# Patient Record
Sex: Male | Born: 2000 | Hispanic: No | Marital: Single | Smoking: Current some day smoker
Health system: Southern US, Community
[De-identification: ages and names within clinical notes are randomized; demographics above are authoritative.]

## PROBLEM LIST (undated history)

## (undated) ENCOUNTER — Ambulatory Visit (HOSPITAL_COMMUNITY): Admission: EM | Payer: 59 | Source: Home / Self Care

## (undated) ENCOUNTER — Ambulatory Visit (HOSPITAL_COMMUNITY): Admission: EM | Payer: 59

## (undated) DIAGNOSIS — F913 Oppositional defiant disorder: Principal | ICD-10-CM

## (undated) DIAGNOSIS — F909 Attention-deficit hyperactivity disorder, unspecified type: Secondary | ICD-10-CM

## (undated) DIAGNOSIS — F819 Developmental disorder of scholastic skills, unspecified: Secondary | ICD-10-CM

## (undated) DIAGNOSIS — J069 Acute upper respiratory infection, unspecified: Secondary | ICD-10-CM

## (undated) HISTORY — DX: Attention-deficit hyperactivity disorder, unspecified type: F90.9

## (undated) HISTORY — DX: Oppositional defiant disorder: F91.3

## (undated) HISTORY — DX: Developmental disorder of scholastic skills, unspecified: F81.9

## (undated) HISTORY — DX: Acute upper respiratory infection, unspecified: J06.9

---
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
## 2019-06-04 ENCOUNTER — Encounter (HOSPITAL_COMMUNITY): Payer: Self-pay | Admitting: Psychiatry

## 2019-06-04 ENCOUNTER — Ambulatory Visit (INDEPENDENT_AMBULATORY_CARE_PROVIDER_SITE_OTHER): Payer: 59 | Admitting: Psychiatry

## 2019-06-04 ENCOUNTER — Other Ambulatory Visit: Payer: Self-pay

## 2019-06-04 DIAGNOSIS — F902 Attention-deficit hyperactivity disorder, combined type: Secondary | ICD-10-CM | POA: Diagnosis not present

## 2019-06-04 DIAGNOSIS — F819 Developmental disorder of scholastic skills, unspecified: Secondary | ICD-10-CM

## 2019-06-04 MED ORDER — CLONIDINE HCL 0.2 MG PO TABS: ORAL_TABLET | ORAL | 1 refills | Status: DC

## 2019-06-04 MED ORDER — GUANFACINE HCL ER 4 MG PO TB24: 4.0000 mg | ORAL_TABLET | Freq: Every day | ORAL | 1 refills | Status: DC

## 2019-06-04 NOTE — Progress Notes (Signed)
Virtual Visit via Telephone Note  I connected with Dillon Rodriguez on 06/04/19 at  2:00 PM EDT by telephone and verified that I am speaking with the correct person using two identifiers.   I discussed the limitations, risks, security and privacy concerns of performing an evaluation and management service by telephone and the availability of in person appointments. I also discussed with the patient that there may be a patient responsible charge related to this service. The patient expressed understanding and agreed to proceed.   History of Present Illness: Patient was evaluated by phone session.  I also spoke to his mother who was available.  Patient is doing well on his medication.  He is taking school year off because of the [place] and now actively looking for a job.  He reported his attention and focus is better and is stable.  He is taking the medication and do not report any side effects.  He denies any agitation irritability and his mother endorsed that his behavior is much better.  He is sleeping all night but sometimes he also sleep during the day because he is bored.  He lives with his parents.  Denies drinking or using any illegal substances.  His appetite is okay.  His energy level is good.  He reported his weight is a stable.  He like to continue his current medication.  He has no tremors shakes or any EPS.   Past Psychiatric History: Reviewed. H/O ADHD and seeing outpatient since 2013.  Took Adderall and Vyvanse (dont recall). Took Metadate but discontinued due to poor appetite.  Taking Intuniv and clonidine with good response.  No h/o inpatient, suicidal attempt, mania, psychosis or self abusive behavior.  Psychiatric Specialty Exam: Physical Exam  ROS  There were no vitals taken for this visit.There is no height or weight on file to calculate BMI.  General Appearance: NA  Eye Contact:  NA  Speech:  Clear and Coherent and Slow  Volume:  Normal  Mood:  Euthymic  Affect:  NA  Thought  Process:  Descriptions of Associations: Intact  Orientation:  Full (Time, Place, and Person)  Thought Content:  WDL  Suicidal Thoughts:  No  Homicidal Thoughts:  No  Memory:  Immediate;   Fair Recent;   Good Remote;   Good  Judgement:  Good  Insight:  Good  Psychomotor Activity:  NA  Concentration:  Concentration: Fair and Attention Span: Fair  Recall:  Good  Fund of Knowledge:  Good  Language:  Good  Akathisia:  No  Handed:  Right  AIMS (if indicated):     Assets:  Communication Skills Desire for Improvement Housing Resilience Social Support  ADL's:  Intact  Cognition:  WNL  Sleep:   ok     Assessment and Plan: ADHD, combined type.  Learning disability.  Patient is a stable on his current medication.  I will continue clonidine 0.2 mg at bedtime and Intuniv 4 mg daily.  He is actively looking for a job.  Discussed medication side effects and benefits.  Recommended to call us back if is any question of any concern.  Follow-up in 3 months.  Follow Up Instructions:    I discussed the assessment and treatment plan with the patient. The patient was provided an opportunity to ask questions and all were answered. The patient agreed with the plan and demonstrated an understanding of the instructions.   The patient was advised to call back or seek an in-person evaluation if the symptoms worsen  or if the condition fails to improve as anticipated.  I provided 20 minutes of non-face-to-face time during this encounter.   Kathlee Nations, MD

## 2019-12-02 ENCOUNTER — Ambulatory Visit (INDEPENDENT_AMBULATORY_CARE_PROVIDER_SITE_OTHER): Payer: 59 | Admitting: Psychiatry

## 2019-12-02 ENCOUNTER — Other Ambulatory Visit: Payer: Self-pay

## 2019-12-02 ENCOUNTER — Encounter (HOSPITAL_COMMUNITY): Payer: Self-pay | Admitting: Psychiatry

## 2019-12-02 DIAGNOSIS — F902 Attention-deficit hyperactivity disorder, combined type: Secondary | ICD-10-CM | POA: Diagnosis not present

## 2019-12-02 DIAGNOSIS — F819 Developmental disorder of scholastic skills, unspecified: Secondary | ICD-10-CM | POA: Diagnosis not present

## 2019-12-02 MED ORDER — GUANFACINE HCL ER 4 MG PO TB24: 4.0000 mg | ORAL_TABLET | Freq: Every day | ORAL | 1 refills | Status: DC

## 2019-12-02 MED ORDER — CLONIDINE HCL 0.2 MG PO TABS: ORAL_TABLET | ORAL | 1 refills | Status: DC

## 2019-12-02 NOTE — Progress Notes (Addendum)
Virtual Visit via Telephone Note  I connected with Dillon Rodriguez on 12/02/19 at  2:00 PM EDT by telephone and verified that I am speaking with the correct person using two identifiers.   I discussed the limitations, risks, security and privacy concerns of performing an evaluation and management service by telephone and the availability of in person appointments. I also discussed with the patient that there may be a patient responsible charge related to this service. The patient expressed understanding and agreed to proceed.  Patient Location; Home Provider Location; Office Home  History of Present Illness: Patient was evaluated by phone session.  I also spoke to his mother who was available.  Apparently patient doing very well on medication.  He started part-time working at [employer] and he is happy his job.  He also wants to start school this year.  He denies any irritability, anger or any behavior problem.  His attention concentration is good.  He is sleeping good with the medication.  He lives with his parents.  He denies drinking or using any illegal substances.  He is trying to lose weight and so far he has lost few pounds in past 6 months.  He has no tremors, shakes or any EPS.  He like to continue clonidine and Intuniv.   Past Psychiatric History:Reviewed. H/OADHD LD. Seeing outpatient since 2013. Took Adderall and Vyvanse (dontrecall).Took Metadate but caused poor appetite. Good response Inutuniv and clonidine. No h/oinpatient,suicidal attempt,mania, psychosis or self abusive behavior.  Psychiatric Specialty Exam: Physical Exam  Review of Systems  Weight 132 lb (59.9 kg).There is no height or weight on file to calculate BMI.  General Appearance: NA  Eye Contact:  NA  Speech:  Clear and Coherent and Normal Rate  Volume:  Normal  Mood:  Euthymic  Affect:  NA  Thought Process:  Goal Directed  Orientation:  Full (Time, Place, and Person)  Thought Content:  WDL  Suicidal  Thoughts:  No  Homicidal Thoughts:  No  Memory:  Immediate;   Good Recent;   Fair Remote;   Fair  Judgement:  Intact  Insight:  Present  Psychomotor Activity:  NA  Concentration:  Concentration: Fair and Attention Span: Fair  Recall:  Good  Fund of Knowledge:  Good  Language:  Good  Akathisia:  No  Handed:  Right  AIMS (if indicated):     Assets:  Communication Skills Desire for Improvement Physical Health Resilience Social Support  ADL's:  Intact  Cognition:  WNL  Sleep:   good     Assessment and Plan: ADHD, combined type.  Learning disability.  Patient is a stable on her current medication.  Is able to focus and able to do his work without any issues.  His behavior is under control.  Continue clonidine 0.2 mg at bedtime and Intuniv 4 mg daily.  He started working part-time but also hoping to start school this year.  Discussed medication side effects and benefits.  Recommended to call us back if is any question or any concern.  Follow-up in 6 months.  Follow Up Instructions:    I discussed the assessment and treatment plan with the patient. The patient was provided an opportunity to ask questions and all were answered. The patient agreed with the plan and demonstrated an understanding of the instructions.   The patient was advised to call back or seek an in-person evaluation if the symptoms worsen or if the condition fails to improve as anticipated.  I provided 15 minutes  of non-face-to-face time during this encounter.   Kathlee Nations, MD

## 2020-05-30 ENCOUNTER — Other Ambulatory Visit: Payer: Self-pay

## 2020-05-30 ENCOUNTER — Encounter (HOSPITAL_COMMUNITY): Payer: Self-pay | Admitting: Psychiatry

## 2020-05-30 ENCOUNTER — Telehealth (INDEPENDENT_AMBULATORY_CARE_PROVIDER_SITE_OTHER): Payer: 59 | Admitting: Psychiatry

## 2020-05-30 DIAGNOSIS — F819 Developmental disorder of scholastic skills, unspecified: Secondary | ICD-10-CM

## 2020-05-30 DIAGNOSIS — F902 Attention-deficit hyperactivity disorder, combined type: Secondary | ICD-10-CM | POA: Diagnosis not present

## 2020-05-30 MED ORDER — CLONIDINE HCL 0.2 MG PO TABS: ORAL_TABLET | ORAL | 1 refills | Status: DC

## 2020-05-30 MED ORDER — GUANFACINE HCL ER 4 MG PO TB24: 4.0000 mg | ORAL_TABLET | Freq: Every day | ORAL | 1 refills | Status: DC

## 2020-05-30 NOTE — Progress Notes (Signed)
Virtual Visit via Telephone Note  I connected with Dillon Rodriguez on 05/30/20 at  2:00 PM EDT by telephone and verified that I am speaking with the correct person using two identifiers.  Location: Patient: home Provider: home office   I discussed the limitations, risks, security and privacy concerns of performing an evaluation and management service by telephone and the availability of in person appointments. I also discussed with the patient that there may be a patient responsible charge related to this service. The patient expressed understanding and agreed to proceed.   History of Present Illness: Patient is evaluated by phone session.  I also spoke to his mother who is available.  Patient is doing well on his medication.  He is starting to school at [employer] and so far things are going well.  He stopped working part-time at [employer].  He is sleeping good.  Denies any anger, mood swings, irritability and we reported medicine is helping his attention and concentration.  He enjoys going to school and he has no behavior problems.  His appetite is okay.  He has no tremors, shakes or any EPS.  He has not seen his PCP in a while and he has no blood work.  He denies drinking or using any illegal substances.  He like to keep his current medication.  Past Psychiatric History:Reviewed. H/OADHD LD. Seeing outpatient since 2013. Took Adderall and Vyvanse (dontrecall).Took Metadate but caused poor appetite. Inutuniv and clonidine worked. No h/oinpatient,suicidal attempt,mania, psychosis or self abusive behavior.  Psychiatric Specialty Exam: Physical Exam  Review of Systems  Weight 132 lb (59.9 kg).There is no height or weight on file to calculate BMI.  General Appearance: NA  Eye Contact:  NA  Speech:  Slow  Volume:  Decreased  Mood:  Euthymic  Affect:  NA  Thought Process:  Goal Directed  Orientation:  Full (Time, Place, and Person)  Thought Content:  WDL  Suicidal Thoughts:  No  Homicidal  Thoughts:  No  Memory:  Immediate;   Good Recent;   Fair Remote;   Fair  Judgement:  Intact  Insight:  Present  Psychomotor Activity:  NA  Concentration:  Concentration: Fair and Attention Span: Fair  Recall:  [employer] of Knowledge:  Fair  Language:  Good  Akathisia:  No  Handed:  Right  AIMS (if indicated):     Assets:  Communication Skills Desire for Improvement Housing Social Support  ADL's:  Intact  Cognition:  WNL  Sleep:   good      Assessment and Plan: ADHD, combined type.  Learning disability.  Patient is a stable on his current medication.  His attention, concentration and behavior is under control.  He is sleeping good.  He is started school and he feels good about it.  Continue clonidine 0.2 mg at bedtime and Intuniv 4 mg daily.  He has not done blood work in a while but agreed to have it done now.  We will order CBC, CMP, hemoglobin A1c and TSH.  Recommended to call us back if there is any question or any concern.  Follow-up in 6 months.  Follow Up Instructions:    I discussed the assessment and treatment plan with the patient. The patient was provided an opportunity to ask questions and all were answered. The patient agreed with the plan and demonstrated an understanding of the instructions.   The patient was advised to call back or seek an in-person evaluation if the symptoms worsen or if the condition  fails to improve as anticipated.  I provided 21 minutes of non-face-to-face time during this encounter.   Cleotis Nipper, MD

## 2020-05-31 ENCOUNTER — Other Ambulatory Visit (HOSPITAL_COMMUNITY): Payer: Self-pay | Admitting: *Deleted

## 2020-05-31 ENCOUNTER — Telehealth (HOSPITAL_COMMUNITY): Payer: Self-pay | Admitting: *Deleted

## 2020-05-31 DIAGNOSIS — F902 Attention-deficit hyperactivity disorder, combined type: Secondary | ICD-10-CM

## 2020-05-31 NOTE — Telephone Encounter (Signed)
Writer placed t/c to pt and spoke with mother regarding going to have labs drawn @ [address], [place] as pt lives in [place]. Mother to take pt on 06/03/20.

## 2020-11-28 ENCOUNTER — Telehealth (HOSPITAL_COMMUNITY): Payer: Medicaid Other | Admitting: Psychiatry

## 2020-11-29 ENCOUNTER — Telehealth (INDEPENDENT_AMBULATORY_CARE_PROVIDER_SITE_OTHER): Payer: Commercial Managed Care - HMO | Admitting: Psychiatry

## 2020-11-29 ENCOUNTER — Encounter (HOSPITAL_COMMUNITY): Payer: Self-pay | Admitting: Psychiatry

## 2020-11-29 ENCOUNTER — Other Ambulatory Visit: Payer: Self-pay

## 2020-11-29 DIAGNOSIS — F902 Attention-deficit hyperactivity disorder, combined type: Secondary | ICD-10-CM

## 2020-11-29 DIAGNOSIS — F819 Developmental disorder of scholastic skills, unspecified: Secondary | ICD-10-CM | POA: Diagnosis not present

## 2020-11-29 MED ORDER — GUANFACINE HCL ER 4 MG PO TB24: 4.0000 mg | ORAL_TABLET | Freq: Every day | ORAL | 5 refills | Status: DC

## 2020-11-29 MED ORDER — CLONIDINE HCL 0.2 MG PO TABS: ORAL_TABLET | ORAL | 5 refills | Status: DC

## 2020-11-29 NOTE — Progress Notes (Signed)
Virtual Visit via Telephone Note  I connected with Dillon Rodriguez on 11/29/20 at  1:40 PM EDT by telephone and verified that I am speaking with the correct person using two identifiers.  Location: Patient: Home Provider: Home Office   I discussed the limitations, risks, security and privacy concerns of performing an evaluation and management service by telephone and the availability of in person appointments. I also discussed with the patient that there may be a patient responsible charge related to this service. The patient expressed understanding and agreed to proceed.   History of Present Illness: Patient is evaluated by phone session.  He had stopped school because he felt this is not his future.  He is actively looking for a job.  Even though he was making better grades at [employer] but he withdrew last semester.  So far he has not able to find a job but he is actively looking.  He is sleeping good.  He denies any hyperactivity, irritability, mood swings and he feels the current medicine helping his ADHD symptoms.  His attention and focus is manageable.  He sleeps good.  He admitted weight gain in recent months as he is more active and his appetite is increased.  I also spoke to his mother who reported patient is a stable on the current medication.  Patient and his mother apologized not having blood work but promised to have it done very soon.  So far patient reported no tremors shakes or any EPS.  He denies drinking or using any illegal substances.  Past Psychiatric History:Reviewed. H/OADHDLD. Seeing outpatient since 2013. Took Adderall and Vyvanse (dontrecall).Took Metadate butcausedpoor appetite. Inutuniv and clonidine worked.No h/oinpatient,suicidal attempt,mania, psychosis or self abusive behavior.   Psychiatric Specialty Exam: Physical Exam  Review of Systems  Weight 141 lb (64 kg).There is no height or weight on file to calculate BMI.  General Appearance: NA  Eye Contact:   NA  Speech:  Slow  Volume:  Normal  Mood:  Euthymic  Affect:  NA  Thought Process:  Goal Directed  Orientation:  Full (Time, Place, and Person)  Thought Content:  WDL  Suicidal Thoughts:  No  Homicidal Thoughts:  No  Memory:  Immediate;   Good Recent;   Fair Remote;   Fair  Judgement:  Fair  Insight:  Shallow  Psychomotor Activity:  NA  Concentration:  Concentration: Fair and Attention Span: Fair  Recall:  [employer] of Knowledge:  Fair  Language:  Good  Akathisia:  No  Handed:  Right  AIMS (if indicated):     Assets:  Communication Skills Desire for Improvement Housing Social Support  ADL's:  Intact  Cognition:  WNL  Sleep:   ok      Assessment and Plan: ADHD, combined type.  Learning disability.  Patient like to keep his current medication since it is helping his mood, hyperactivity.  Continue clonidine 0.2 mg at bedtime and Intuniv 4 mg daily.  Reminded have blood work before his next appointment.  He like to keep appointment in 6 months.  Orders were given to the [facility] at [facility].  Discussed medication side effects and benefits.  Patient like to have 30-day supply with 5 more refills as his insurance does not approve a 90-day prescription.  Follow-up in 6 months.  Follow Up Instructions:    I discussed the assessment and treatment plan with the patient. The patient was provided an opportunity to ask questions and all were answered. The patient agreed with  the plan and demonstrated an understanding of the instructions.   The patient was advised to call back or seek an in-person evaluation if the symptoms worsen or if the condition fails to improve as anticipated.  I provided 16 minutes of non-face-to-face time during this encounter.   Kathlee Nations, MD

## 2021-03-30 ENCOUNTER — Other Ambulatory Visit (HOSPITAL_COMMUNITY): Payer: Self-pay | Admitting: Psychiatry

## 2021-03-30 DIAGNOSIS — F902 Attention-deficit hyperactivity disorder, combined type: Secondary | ICD-10-CM

## 2021-03-30 DIAGNOSIS — F819 Developmental disorder of scholastic skills, unspecified: Secondary | ICD-10-CM

## 2021-05-01 ENCOUNTER — Other Ambulatory Visit (HOSPITAL_COMMUNITY): Payer: Self-pay | Admitting: Psychiatry

## 2021-05-01 DIAGNOSIS — F902 Attention-deficit hyperactivity disorder, combined type: Secondary | ICD-10-CM

## 2021-05-01 DIAGNOSIS — F819 Developmental disorder of scholastic skills, unspecified: Secondary | ICD-10-CM

## 2021-05-27 ENCOUNTER — Other Ambulatory Visit (HOSPITAL_COMMUNITY): Payer: Self-pay | Admitting: Psychiatry

## 2021-05-27 DIAGNOSIS — F902 Attention-deficit hyperactivity disorder, combined type: Secondary | ICD-10-CM

## 2021-05-27 DIAGNOSIS — F819 Developmental disorder of scholastic skills, unspecified: Secondary | ICD-10-CM

## 2021-05-31 ENCOUNTER — Other Ambulatory Visit (HOSPITAL_COMMUNITY): Payer: Self-pay | Admitting: Psychiatry

## 2021-05-31 DIAGNOSIS — F902 Attention-deficit hyperactivity disorder, combined type: Secondary | ICD-10-CM

## 2021-05-31 DIAGNOSIS — F819 Developmental disorder of scholastic skills, unspecified: Secondary | ICD-10-CM

## 2021-06-01 ENCOUNTER — Other Ambulatory Visit: Payer: Self-pay

## 2021-06-01 ENCOUNTER — Encounter (HOSPITAL_COMMUNITY): Payer: Self-pay | Admitting: Psychiatry

## 2021-06-01 ENCOUNTER — Telehealth (INDEPENDENT_AMBULATORY_CARE_PROVIDER_SITE_OTHER): Payer: 59 | Admitting: Psychiatry

## 2021-06-01 DIAGNOSIS — F902 Attention-deficit hyperactivity disorder, combined type: Secondary | ICD-10-CM

## 2021-06-01 DIAGNOSIS — F819 Developmental disorder of scholastic skills, unspecified: Secondary | ICD-10-CM

## 2021-06-01 MED ORDER — CLONIDINE HCL 0.2 MG PO TABS: ORAL_TABLET | ORAL | 2 refills | Status: DC

## 2021-06-01 NOTE — Progress Notes (Signed)
Virtual Visit via Telephone Note  I connected with Dillon Rodriguez on 06/01/21 at  2:00 PM EDT by telephone and verified that I am speaking with the correct person using two identifiers.  Location: Patient: Home Provider: Office   I discussed the limitations, risks, security and privacy concerns of performing an evaluation and management service by telephone and the availability of in person appointments. I also discussed with the patient that there may be a patient responsible charge related to this service. The patient expressed understanding and agreed to proceed.   History of Present Illness: Patient is evaluated by phone session.  He is not taking Intuniv for the past few weeks because he does not feel he is hyper anymore.  He is more calm but is still need clonidine for sleep.  He has not able to find a job and he is not in the school.  He lives with his mother.  He apologized not having blood work.  He reported being active at home.  Denies any anger, mood swings, impulsive behavior or agitation.  In the summer he went to [place] to visit his family member and had a birthday there.  He has no tremor or shakes or any EPS.  He denies drinking or using any illegal substances.  He like to keep the clonidine to help his sleep and attention and focus.  Past Psychiatric History: Reviewed. H/O ADHD LD. Seeing outpatient since 2013.  Took Adderall and Vyvanse (dont recall). Took Metadate but caused poor appetite.  Inutuniv and clonidine worked. No h/o inpatient, suicidal attempt, mania, psychosis or self abusive behavior.   Psychiatric Specialty Exam: Physical Exam  Review of Systems  Weight 145 lb (65.8 kg).Body mass index is 24.13 kg/m.  General Appearance: NA  Eye Contact:  NA  Speech:  Slow  Volume:  Decreased  Mood:  Euthymic  Affect:  NA  Thought Process:  Goal Directed  Orientation:  Full (Time, Place, and Person)  Thought Content:  Logical  Suicidal Thoughts:  No   Homicidal Thoughts:  No  Memory:  Immediate;   Good Recent;   Fair Remote;   Fair  Judgement:  Fair  Insight:  Shallow  Psychomotor Activity:  NA  Concentration:  Concentration: Fair and Attention Span: Fair  Recall:  Good  Fund of Knowledge:  Good  Language:  Good  Akathisia:  No  Handed:  Right  AIMS (if indicated):     Assets:  Communication Skills Desire for Improvement Housing Social Support  ADL's:  Intact  Cognition:  WNL  Sleep:   ok     Assessment and Plan: ADHD, combined type.  Learning disability.  Patient stopped taking the Intuniv but like to keep the clonidine 0.2 mg at bedtime to help his insomnia attention and focus.  Reminded that he needed blood work and he promised to have it done soon.  I recommend if he started to have hyperactivity symptoms then call us back so we can resume Intuniv.  Patient agreed with the plan.  Follow up in 3 months  Follow Up Instructions:    I discussed the assessment and treatment plan with the patient. The patient was provided an opportunity to ask questions and all were answered. The patient agreed with the plan and demonstrated an understanding of the instructions.   The patient was advised to call back or seek an in-person evaluation if the symptoms worsen or if the condition fails to improve as anticipated.  I provided 14 minutes  of non-face-to-face time during this encounter.   Dillon Nations, MD

## 2021-06-03 ENCOUNTER — Other Ambulatory Visit (HOSPITAL_COMMUNITY): Payer: Self-pay | Admitting: Psychiatry

## 2021-06-03 DIAGNOSIS — F819 Developmental disorder of scholastic skills, unspecified: Secondary | ICD-10-CM

## 2021-06-03 DIAGNOSIS — F902 Attention-deficit hyperactivity disorder, combined type: Secondary | ICD-10-CM

## 2021-08-08 ENCOUNTER — Other Ambulatory Visit: Payer: Self-pay

## 2021-08-08 ENCOUNTER — Telehealth (HOSPITAL_COMMUNITY): Payer: Medicaid Other | Admitting: Psychiatry

## 2021-08-21 ENCOUNTER — Telehealth (HOSPITAL_COMMUNITY): Payer: Medicaid Other | Admitting: Psychiatry

## 2021-08-30 ENCOUNTER — Other Ambulatory Visit (HOSPITAL_COMMUNITY): Payer: Self-pay | Admitting: Psychiatry

## 2021-08-30 DIAGNOSIS — F819 Developmental disorder of scholastic skills, unspecified: Secondary | ICD-10-CM

## 2021-08-30 DIAGNOSIS — F902 Attention-deficit hyperactivity disorder, combined type: Secondary | ICD-10-CM

## 2021-09-05 ENCOUNTER — Other Ambulatory Visit (HOSPITAL_COMMUNITY): Payer: Self-pay | Admitting: *Deleted

## 2021-09-05 DIAGNOSIS — F819 Developmental disorder of scholastic skills, unspecified: Secondary | ICD-10-CM

## 2021-09-05 DIAGNOSIS — F902 Attention-deficit hyperactivity disorder, combined type: Secondary | ICD-10-CM

## 2021-09-05 MED ORDER — GUANFACINE HCL ER 4 MG PO TB24: 4.0000 mg | ORAL_TABLET | Freq: Every day | ORAL | 0 refills | Status: DC

## 2021-09-05 MED ORDER — CLONIDINE HCL 0.2 MG PO TABS: ORAL_TABLET | ORAL | 0 refills | Status: DC

## 2021-09-06 ENCOUNTER — Other Ambulatory Visit (HOSPITAL_COMMUNITY): Payer: Self-pay | Admitting: Psychiatry

## 2021-09-06 DIAGNOSIS — F819 Developmental disorder of scholastic skills, unspecified: Secondary | ICD-10-CM

## 2021-09-06 DIAGNOSIS — F902 Attention-deficit hyperactivity disorder, combined type: Secondary | ICD-10-CM

## 2021-09-18 ENCOUNTER — Other Ambulatory Visit: Payer: Self-pay

## 2021-09-18 ENCOUNTER — Encounter (HOSPITAL_COMMUNITY): Payer: Self-pay | Admitting: Psychiatry

## 2021-09-18 ENCOUNTER — Telehealth (HOSPITAL_BASED_OUTPATIENT_CLINIC_OR_DEPARTMENT_OTHER): Payer: 59 | Admitting: Psychiatry

## 2021-09-18 DIAGNOSIS — F902 Attention-deficit hyperactivity disorder, combined type: Secondary | ICD-10-CM

## 2021-09-18 DIAGNOSIS — F819 Developmental disorder of scholastic skills, unspecified: Secondary | ICD-10-CM | POA: Diagnosis not present

## 2021-09-18 MED ORDER — CLONIDINE HCL 0.2 MG PO TABS: ORAL_TABLET | ORAL | 2 refills | Status: DC

## 2021-09-18 NOTE — Progress Notes (Signed)
Virtual Visit via Telephone Note  I connected with Dillon Rodriguez on 09/18/21 at 11:20 AM EST by telephone and verified that I am speaking with the correct person using two identifiers.  Location: Patient: Home Provider: Home Office   I discussed the limitations, risks, security and privacy concerns of performing an evaluation and management service by telephone and the availability of in person appointments. I also discussed with the patient that there may be a patient responsible charge related to this service. The patient expressed understanding and agreed to proceed.   History of Present Illness: Patient is evaluated by phone session.  He is taking clonidine every night but is helping his sleep and ADHD symptoms.  He has not taken Intuniv for more than 3 months.  He admitted sometime talkative but not hyper or agitated.  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  He is not sure if he look for a job in the future.  He lives with his mother.  He apologized for not having the blood but now he is not working he is hoping to have it done very soon.  Denies any anxiety, mania, impulsive behavior.  He has no tremor or shakes or any EPS.  He sleeps good.  Like to keep his clonidine which is helping his attention, focus, hyperactivity and sleep.    Past Psychiatric History: Reviewed. H/O ADHD LD. Seeing outpatient since 2013.  Took Adderall and Vyvanse (dont recall). Took Metadate but caused poor appetite.  Inutuniv and clonidine worked. No h/o inpatient, suicidal attempt, mania, psychosis or self abusive behavior.  Psychiatric Specialty Exam: Physical Exam  Review of Systems  Weight 143 lb (64.9 kg).There is no height or weight on file to calculate BMI.  General Appearance: NA  Eye Contact:  NA  Speech:  Normal Rate  Volume:  Normal  Mood:  Euthymic  Affect:  NA  Thought Process:  Goal Directed  Orientation:  Full (Time, Place, and Person)   Thought Content:  Logical  Suicidal Thoughts:  No  Homicidal Thoughts:  No  Memory:  Immediate;   Good Recent;   Fair Remote;   Fair  Judgement:  Fair  Insight:  Present  Psychomotor Activity:  NA  Concentration:  Concentration: Fair and Attention Span: Fair  Recall:  [employer] of Knowledge:  Fair  Language:  Fair  Akathisia:  No  Handed:  Right  AIMS (if indicated):     Assets:  Communication Skills Desire for Improvement Housing Social Support  ADL's:  Intact  Cognition:  WNL  Sleep:   ok      Assessment and Plan: ADHD, combined type.  Learning disability.  Patient is taking clonidine 0.2 mg at bedtime.  He is not taking Intuniv.  Discussed medication side effects and benefits.  Reminded that he needed blood work and he promised to have it done next week.  Recommended to call us back if is any question or any concern.  Follow-up in 3 months.  Follow Up Instructions:    I discussed the assessment and treatment plan with the patient. The patient was provided an opportunity to ask questions and all were answered. The patient agreed with the plan and demonstrated an understanding of the instructions.   The patient was advised to call back or seek an in-person evaluation if the symptoms worsen or if the condition fails to improve as anticipated.  I provided 21  minutes of non-face-to-face time during this encounter.   Kathlee Nations, MD

## 2021-10-06 ENCOUNTER — Other Ambulatory Visit (HOSPITAL_COMMUNITY): Payer: Self-pay

## 2021-10-06 DIAGNOSIS — Z79899 Other long term (current) drug therapy: Secondary | ICD-10-CM

## 2021-10-06 DIAGNOSIS — F819 Developmental disorder of scholastic skills, unspecified: Secondary | ICD-10-CM

## 2021-10-06 DIAGNOSIS — F902 Attention-deficit hyperactivity disorder, combined type: Secondary | ICD-10-CM

## 2021-10-07 LAB — CBC WITH DIFFERENTIAL
Basophils Absolute: 0.1 10*3/uL (ref 0.0–0.2)
Basos: 1 %
EOS (ABSOLUTE): 0 10*3/uL (ref 0.0–0.4)
Eos: 1 %
Hematocrit: 52.3 % — ABNORMAL HIGH (ref 37.5–51.0)
Hemoglobin: 17.2 g/dL (ref 13.0–17.7)
Immature Grans (Abs): 0 10*3/uL (ref 0.0–0.1)
Immature Granulocytes: 0 %
Lymphocytes Absolute: 1.8 10*3/uL (ref 0.7–3.1)
Lymphs: 39 %
MCH: 27.9 pg (ref 26.6–33.0)
MCHC: 32.9 g/dL (ref 31.5–35.7)
MCV: 85 fL (ref 79–97)
Monocytes Absolute: 0.4 10*3/uL (ref 0.1–0.9)
Monocytes: 10 %
Neutrophils Absolute: 2.3 10*3/uL (ref 1.4–7.0)
Neutrophils: 49 %
RBC: 6.17 x10E6/uL — ABNORMAL HIGH (ref 4.14–5.80)
RDW: 11.8 % (ref 11.6–15.4)
WBC: 4.5 10*3/uL (ref 3.4–10.8)

## 2021-10-07 LAB — COMPREHENSIVE METABOLIC PANEL
ALT: 33 IU/L (ref 0–44)
AST: 19 IU/L (ref 0–40)
Albumin/Globulin Ratio: 2.2 (ref 1.2–2.2)
Albumin: 4.9 g/dL (ref 4.1–5.2)
Alkaline Phosphatase: 61 IU/L (ref 44–121)
BUN/Creatinine Ratio: 12 (ref 9–20)
BUN: 13 mg/dL (ref 6–20)
Bilirubin Total: 0.6 mg/dL (ref 0.0–1.2)
CO2: 26 mmol/L (ref 20–29)
Calcium: 10.2 mg/dL (ref 8.7–10.2)
Chloride: 101 mmol/L (ref 96–106)
Creatinine, Ser: 1.08 mg/dL (ref 0.76–1.27)
Globulin, Total: 2.2 g/dL (ref 1.5–4.5)
Glucose: 86 mg/dL (ref 70–99)
Potassium: 4.5 mmol/L (ref 3.5–5.2)
Sodium: 140 mmol/L (ref 134–144)
Total Protein: 7.1 g/dL (ref 6.0–8.5)
eGFR: 100 mL/min/{1.73_m2} (ref >59)

## 2021-10-07 LAB — HEMOGLOBIN A1C
Est. average glucose Bld gHb Est-mCnc: 117 mg/dL
Hgb A1c MFr Bld: 5.7 % — ABNORMAL HIGH (ref 4.8–5.6)

## 2021-10-07 LAB — TSH: TSH: 2.04 u[IU]/mL (ref 0.450–4.500)

## 2021-10-09 ENCOUNTER — Telehealth (HOSPITAL_COMMUNITY): Payer: Self-pay | Admitting: *Deleted

## 2021-10-09 NOTE — Telephone Encounter (Signed)
Lab resulted on 10/07/21 faxed to office form [facility]. HgbA1c 5.7. RBC 6.17. HCT 52.3. all other labs wnl.

## 2021-10-11 DIAGNOSIS — R7303 Prediabetes: Secondary | ICD-10-CM | POA: Insufficient documentation

## 2021-10-17 DIAGNOSIS — R030 Elevated blood-pressure reading, without diagnosis of hypertension: Secondary | ICD-10-CM | POA: Insufficient documentation

## 2021-10-17 DIAGNOSIS — Z0000 Encounter for general adult medical examination without abnormal findings: Secondary | ICD-10-CM | POA: Insufficient documentation

## 2021-11-30 ENCOUNTER — Other Ambulatory Visit: Payer: Self-pay

## 2021-11-30 ENCOUNTER — Emergency Department (HOSPITAL_COMMUNITY)
Admission: EM | Admit: 2021-11-30 | Discharge: 2021-11-30 | Disposition: A | Discharge status: Home / Self Care | Payer: 59 | Attending: Emergency Medicine | Admitting: Emergency Medicine

## 2021-11-30 ENCOUNTER — Encounter (HOSPITAL_COMMUNITY): Payer: Self-pay | Admitting: Oncology

## 2021-11-30 DIAGNOSIS — R0789 Other chest pain: Secondary | ICD-10-CM | POA: Insufficient documentation

## 2021-11-30 DIAGNOSIS — R0602 Shortness of breath: Secondary | ICD-10-CM | POA: Insufficient documentation

## 2021-11-30 DIAGNOSIS — F12920 Cannabis use, unspecified with intoxication, uncomplicated: Secondary | ICD-10-CM | POA: Diagnosis not present

## 2021-11-30 DIAGNOSIS — L509 Urticaria, unspecified: Secondary | ICD-10-CM | POA: Insufficient documentation

## 2021-11-30 LAB — RAPID URINE DRUG SCREEN, HOSP PERFORMED
Amphetamines: NOT DETECTED
Barbiturates: NOT DETECTED
Benzodiazepines: NOT DETECTED
Cocaine: NOT DETECTED
Opiates: NOT DETECTED
Tetrahydrocannabinol: POSITIVE — AB

## 2021-11-30 NOTE — ED Provider Notes (Signed)
?[facility]-EMERGENCY DEPT ?Provider Note ? ? ?CSN: [id number] ?Arrival date & time: 11/30/21  1737 ? ?  ? ?History ? ?Chief Complaint  ?Patient presents with  ? Ingestion  ? ? ?Dillon Rodriguez is a 21 y.o. male. ? ?HPI ?Patient is a 21 year old male who presents to the emergency department via EMS with his parents due to ingestion.  Patient states that he used to smoke marijuana when he was in high school but has not for many years.  States that he purchased delta 8 from a local gas station and smoked it around 3 PM this afternoon.  He had an episode of shortness of breath, hives, and chest tightness after doing so so EMS was contacted.  He was given 500 cc of IV fluids as well as 50 mg of p.o. Benadryl.  He states that he currently feels fatigued but otherwise is asymptomatic.  Denies any headache, chest pain, shortness of breath, abdominal pain, nausea, vomiting.  He is concerned that the drug he took could have been contaminated with another drug and requests a UDS. ?  ? ?Home Medications ?Prior to Admission medications   ?Medication Sig Start Date End Date Taking? Authorizing Provider  ?cloNIDine (CATAPRES) 0.2 MG tablet Take one each evening 09/18/21   Arfeen, Phillips Grout, MD  ?   ? ?Allergies    ?Patient has no known allergies.   ? ?Review of Systems   ?Review of Systems  ?All other systems reviewed and are negative. ?Ten systems reviewed and are negative for acute change, except as noted in the HPI.   ?Physical Exam ?Updated Vital Signs ?BP 132/79 (BP Location: Right Arm)   Pulse 96   Temp 98.2 ?F (36.8 ?C) (Oral)   Resp 13   Ht 5\' 5"  (1.651 m)   Wt 64.4 kg   SpO2 98%   BMI 23.63 kg/m?  ?Physical Exam ?Vitals and nursing note reviewed.  ?Constitutional:   ?   General: He is not in acute distress. ?   Appearance: Normal appearance. He is normal weight. He is not ill-appearing, toxic-appearing or diaphoretic.  ?HENT:  ?   Head: Normocephalic and atraumatic.  ?   Right Ear: External ear  normal.  ?   Left Ear: External ear normal.  ?   Nose: Nose normal.  ?   Mouth/Throat:  ?   Mouth: Mucous membranes are moist.  ?   Pharynx: Oropharynx is clear. No oropharyngeal exudate or posterior oropharyngeal erythema.  ?   Comments: Uvula midline.  No angioedema.  Readily handling secretions.  No stridor. ?Eyes:  ?   General: No scleral icterus.    ?   Right eye: No discharge.     ?   Left eye: No discharge.  ?   Extraocular Movements: Extraocular movements intact.  ?   Conjunctiva/sclera: Conjunctivae normal.  ?Cardiovascular:  ?   Rate and Rhythm: Normal rate and regular rhythm.  ?   Pulses: Normal pulses.  ?   Heart sounds: Normal heart sounds. No murmur heard. ?  No friction rub. No gallop.  ?   Comments: RRR without M/R/G. ?Pulmonary:  ?   Effort: Pulmonary effort is normal. No respiratory distress.  ?   Breath sounds: Normal breath sounds. No stridor. No wheezing, rhonchi or rales.  ?   Comments: Lungs are clear to auscultation bilaterally.  No wheezing, rales, or rhonchi. ?Abdominal:  ?   General: Abdomen is flat.  ?   Palpations: Abdomen is soft.  ?  Tenderness: There is no abdominal tenderness.  ?   Comments: Abdomen is soft and nontender.  ?Musculoskeletal:     ?   General: Normal range of motion.  ?   Cervical back: Normal range of motion and neck supple. No tenderness.  ?Skin: ?   General: Skin is warm and dry.  ?   Comments: No rash.  No hives.  ?Neurological:  ?   General: No focal deficit present.  ?   Mental Status: He is alert and oriented to person, place, and time.  ?Psychiatric:     ?   Mood and Affect: Mood normal.     ?   Behavior: Behavior normal.  ? ?ED Results / Procedures / Treatments   ?Labs ?(all labs ordered are listed, but only abnormal results are displayed) ?Labs Reviewed  ?RAPID URINE DRUG SCREEN, HOSP PERFORMED - Abnormal; Notable for the following components:  ?    Result Value  ? Tetrahydrocannabinol POSITIVE (*)   ? All other components within normal limits   ? ? ?EKG ?None ? ?Radiology ?No results found. ? ?Procedures ?Procedures  ? ? ?Medications Ordered in ED ?Medications - No data to display ? ?ED Course/ Medical Decision Making/ A&P ?  ?                        ?Medical Decision Making ?Amount and/or Complexity of Data Reviewed ?Labs: ordered. ? ?Pt is a 21 y.o. male who presents to the emergency department after ingesting delta 8 marijuana.  He states that he does not regularly use marijuana and has not done so for many years.  This afternoon he smoked delta 8 around 3 PM and began experiencing chest tightness, shortness of breath, as well as hives.  He called EMS who gave him IV fluids as well as p.o. Benadryl.  Upon arrival patient reports fatigue but otherwise denies any complaints.  Denies any chest pain, shortness of breath, rash, difficulty swallowing, nausea, vomiting, abdominal pain. ? ?Labs: ?UDS is positive for tetrahydrocannabinol. ? ?I, Placido Sou, PA-C, personally reviewed and evaluated these images and lab results as part of my medical decision-making. ? ?On my initial exam patient A&O x3.  Speaking clearly and coherently.  Moving all 4 extremities.  No gross deficits.  Heart is regular rate and rhythm without murmurs, rubs, or gallops.  Lungs are clear to auscultation bilaterally.  Abdomen is soft and nontender.  Patient has no physical complaints at this time.  He is concerned that the marijuana that he used could be cross contaminated with another illicit drug so a UDS was obtained which is only positive for tetrahydrocannabinol. ? ?On reassessment patient still asymptomatic.  Appears stable for discharge at this time and he and his parents at bedside are agreeable.  They are going to drive him home.  We discussed return precautions at length.  Their questions were answered and they were amicable at the time of discharge. ? ?Note: Portions of this report may have been transcribed using voice recognition software. Every effort was made to  ensure accuracy; however, inadvertent computerized transcription errors may be present.  ? ?Final Clinical Impression(s) / ED Diagnoses ?Final diagnoses:  ?Cannabis intoxication without complication (HCC)  ? ?Rx / DC Orders ?ED Discharge Orders   ? ? None  ? ?  ? ? ?  ?Placido Sou, PA-C ?11/30/21 2126 ? ?  ?Mancel Bale, MD ?11/30/21 2251 ? ?

## 2021-11-30 NOTE — ED Triage Notes (Signed)
Pt bib [facility] from home d/t ingestion of delta 8 this afternoon.  Pt c/o shob, hives and chest tightness.  Pt tachycardic en route, EMS gave 500 ml bolus of NS.  Fire gave pt 50 mg PO benadryl.   ?

## 2021-11-30 NOTE — Discharge Instructions (Signed)
Please continue to monitor your symptoms closely.  If you develop any new or worsening symptoms please come back to the emergency department immediately. ?

## 2021-12-10 ENCOUNTER — Other Ambulatory Visit (HOSPITAL_COMMUNITY): Payer: Self-pay | Admitting: Psychiatry

## 2021-12-10 DIAGNOSIS — F819 Developmental disorder of scholastic skills, unspecified: Secondary | ICD-10-CM

## 2021-12-10 DIAGNOSIS — F902 Attention-deficit hyperactivity disorder, combined type: Secondary | ICD-10-CM

## 2021-12-15 ENCOUNTER — Telehealth (HOSPITAL_BASED_OUTPATIENT_CLINIC_OR_DEPARTMENT_OTHER): Payer: 59 | Admitting: Psychiatry

## 2021-12-15 ENCOUNTER — Encounter (HOSPITAL_COMMUNITY): Payer: Self-pay | Admitting: Psychiatry

## 2021-12-15 DIAGNOSIS — F902 Attention-deficit hyperactivity disorder, combined type: Secondary | ICD-10-CM | POA: Diagnosis not present

## 2021-12-15 DIAGNOSIS — F819 Developmental disorder of scholastic skills, unspecified: Secondary | ICD-10-CM

## 2021-12-15 MED ORDER — CLONIDINE HCL 0.2 MG PO TABS: ORAL_TABLET | ORAL | 2 refills | Status: DC

## 2021-12-15 NOTE — Progress Notes (Signed)
Virtual Visit via Telephone Note ? ?I connected with Dillon Rodriguez on 12/15/21 at 10:20 AM EDT by telephone and verified that I am speaking with the correct person using two identifiers. ? ?Location: ?Patient: Home ?Provider: Home Office ?  ?I discussed the limitations, risks, security and privacy concerns of performing an evaluation and management service by telephone and the availability of in person appointments. I also discussed with the patient that there may be a patient responsible charge related to this service. The patient expressed understanding and agreed to proceed. ? ? ?History of Present Illness: ?Patient is evaluated by phone session.  Recently he had a visit to the emergency room after he ingested cannabis from a local store.  He experienced chest tightness, shortness of breath and hives and sent to the emergency room.  He was given fluids and UDS positive for cannabis.  Patient told he had smoked weed in the past and thought he can smoke to help his sleep but now he learned that it was mixed with some other agent.  He is taking clonidine which is helping his sleep and hyperactivity.  He does not want to try anything else.  Denies any suicidal thoughts, anger, mania, crying spells or any feeling of hopelessness.  He lives with his mother.  Now he is very interested to start the job at [employer].  He has to go for job training and he is trying to schedule the date.  He does not want to change the medication.  His appetite is okay.  His weight is stable. ? ?Past Psychiatric History: Reviewed. ?H/O ADHD LD. Seeing outpatient since 2013.  Took Adderall and Vyvanse (dont recall). Took Metadate but caused poor appetite.  Inutuniv and clonidine worked. No h/o inpatient, suicidal attempt, mania, psychosis or self abusive behavior. ? ?Recent Results (from the past 2160 hour(s))  ?TSH     Status: None  ? Collection Time: 10/06/21 12:30 PM  ?Result Value Ref Range  ? TSH 2.040 0.450 - 4.500 uIU/mL   ?HgB A1c     Status: Abnormal  ? Collection Time: 10/06/21 12:30 PM  ?Result Value Ref Range  ? Hgb A1c MFr Bld 5.7 (H) 4.8 - 5.6 %  ?  Comment:          Prediabetes: 5.7 - 6.4 ?         Diabetes: >6.4 ?         Glycemic control for adults with diabetes: <7.0 ?  ? Est. average glucose Bld gHb Est-mCnc 117 mg/dL  ?Comprehensive Metabolic Panel (CMET)     Status: None  ? Collection Time: 10/06/21 12:30 PM  ?Result Value Ref Range  ? Glucose 86 70 - 99 mg/dL  ? BUN 13 6 - 20 mg/dL  ? Creatinine, Ser 1.08 0.76 - 1.27 mg/dL  ? eGFR 100 >59 mL/min/1.73  ? BUN/Creatinine Ratio 12 9 - 20  ? Sodium 140 134 - 144 mmol/L  ? Potassium 4.5 3.5 - 5.2 mmol/L  ? Chloride 101 96 - 106 mmol/L  ? CO2 26 20 - 29 mmol/L  ? Calcium 10.2 8.7 - 10.2 mg/dL  ? Total Protein 7.1 6.0 - 8.5 g/dL  ? Albumin 4.9 4.1 - 5.2 g/dL  ? Globulin, Total 2.2 1.5 - 4.5 g/dL  ? Albumin/Globulin Ratio 2.2 1.2 - 2.2  ? Bilirubin Total 0.6 0.0 - 1.2 mg/dL  ? Alkaline Phosphatase 61 44 - 121 IU/L  ? AST 19 0 - 40 IU/L  ? ALT  33 0 - 44 IU/L  ?CBC With Differential     Status: Abnormal  ? Collection Time: 10/06/21 12:30 PM  ?Result Value Ref Range  ? WBC 4.5 3.4 - 10.8 x10E3/uL  ? RBC 6.17 (H) 4.14 - 5.80 x10E6/uL  ? Hemoglobin 17.2 13.0 - 17.7 g/dL  ? Hematocrit 52.3 (H) 37.5 - 51.0 %  ? MCV 85 79 - 97 fL  ? MCH 27.9 26.6 - 33.0 pg  ? MCHC 32.9 31.5 - 35.7 g/dL  ? RDW 11.8 11.6 - 15.4 %  ? Neutrophils 49 Not Estab. %  ? Lymphs 39 Not Estab. %  ? Monocytes 10 Not Estab. %  ? Eos 1 Not Estab. %  ? Basos 1 Not Estab. %  ? Neutrophils Absolute 2.3 1.4 - 7.0 x10E3/uL  ? Lymphocytes Absolute 1.8 0.7 - 3.1 x10E3/uL  ? Monocytes Absolute 0.4 0.1 - 0.9 x10E3/uL  ? EOS (ABSOLUTE) 0.0 0.0 - 0.4 x10E3/uL  ? Basophils Absolute 0.1 0.0 - 0.2 x10E3/uL  ? Immature Granulocytes 0 Not Estab. %  ? Immature Grans (Abs) 0.0 0.0 - 0.1 x10E3/uL  ?Urine rapid drug screen (hosp performed)     Status: Abnormal  ? Collection Time: 11/30/21  7:17 PM  ?Result Value Ref Range  ? Opiates  NONE DETECTED NONE DETECTED  ? Cocaine NONE DETECTED NONE DETECTED  ? Benzodiazepines NONE DETECTED NONE DETECTED  ? Amphetamines NONE DETECTED NONE DETECTED  ? Tetrahydrocannabinol POSITIVE (A) NONE DETECTED  ? Barbiturates NONE DETECTED NONE DETECTED  ?  Comment: (NOTE) ?DRUG SCREEN FOR MEDICAL PURPOSES ?ONLY.  IF CONFIRMATION IS NEEDED ?FOR ANY PURPOSE, NOTIFY LAB ?WITHIN 5 DAYS. ? ?LOWEST DETECTABLE LIMITS ?FOR URINE DRUG SCREEN ?Drug Class                     Cutoff (ng/mL) ?Amphetamine and metabolites    1000 ?Barbiturate and metabolites    200 ?Benzodiazepine                 200 ?Tricyclics and metabolites     300 ?Opiates and metabolites        300 ?Cocaine and metabolites        300 ?THC                            50 ?Performed at [facility], [address] ?[place],  [address] ?  ?  ?  ?Psychiatric Specialty Exam: ?Physical Exam  ?Review of Systems  ?Weight 142 lb (64.4 kg).There is no height or weight on file to calculate BMI.  ?General Appearance: NA  ?Eye Contact:  NA  ?Speech:  Slow  ?Volume:  Normal  ?Mood:  Anxious  ?Affect:  NA  ?Thought Process:  Goal Directed  ?Orientation:  Full (Time, Place, and Person)  ?Thought Content:  WDL  ?Suicidal Thoughts:  No  ?Homicidal Thoughts:  No  ?Memory:  Immediate;   Good ?Recent;   Fair ?Remote;   Fair  ?Judgement:  Fair  ?Insight:  Shallow  ?Psychomotor Activity:  NA  ?Concentration:  Concentration: Fair and Attention Span: Fair  ?Recall:  Fair  ?Fund of Knowledge:  Fair  ?Language:  Fair  ?Akathisia:  No  ?Handed:  Right  ?AIMS (if indicated):     ?Assets:  Communication Skills ?Desire for Improvement ?Housing ?Social Support  ?ADL's:  Intact  ?Cognition:  WNL  ?Sleep:   ok  ? ? ? ? ?  Assessment and Plan: ?ADHD, combined type.  Learning disability. ? ?Review blood work results from the recent emergency room visit.  UDS positive for cannabis.  Patient was provided education not to use illegal substances as it may affect his  medication and cause worsening of symptoms.  He acknowledged and agreed that he will did smoke.  Discussed medication side effects and benefits.  Continue clonidine 0.2 mg at bedtime.  Recommended to call us back if is any question or any concern.  Follow-up in 3 months. ? ?Follow Up Instructions: ? ?  ?I discussed the assessment and treatment plan with the patient. The patient was provided an opportunity to ask questions and all were answered. The patient agreed with the plan and demonstrated an understanding of the instructions. ?  ?The patient was advised to call back or seek an in-person evaluation if the symptoms worsen or if the condition fails to improve as anticipated. ? ?Collaboration of Care: Other provider involved in patient's care AEB notes in epic to review. ? ?Patient/Guardian was advised Release of Information must be obtained prior to any record release in order to collaborate their care with an outside provider. Patient/Guardian was advised if they have not already done so to contact the registration department to sign all necessary forms in order for [place] to release information regarding their care.  ? ?Consent: Patient/Guardian gives verbal consent for treatment and assignment of benefits for services provided during this visit. Patient/Guardian expressed understanding and agreed to proceed.   ? ?I provided 19 minutes of non-face-to-face time during this encounter. ? ? ?Kathlee Nations, MD  ?

## 2022-01-04 ENCOUNTER — Ambulatory Visit (HOSPITAL_COMMUNITY)
Admission: EM | Admit: 2022-01-04 | Discharge: 2022-01-04 | Disposition: A | Discharge status: Home / Self Care | Payer: 59 | Attending: Family Medicine | Admitting: Family Medicine

## 2022-01-04 ENCOUNTER — Encounter (HOSPITAL_COMMUNITY): Payer: Self-pay

## 2022-01-04 DIAGNOSIS — R052 Subacute cough: Secondary | ICD-10-CM

## 2022-01-04 MED ORDER — BENZONATATE 100 MG PO CAPS: 100.0000 mg | ORAL_CAPSULE | Freq: Three times a day (TID) | ORAL | 0 refills | Status: DC

## 2022-01-04 NOTE — ED Provider Notes (Signed)
?[facility] ? ? ? ?CSN: [id number] ?Arrival date & time: 01/04/22  1436 ? ? ?  ? ?History   ?Chief Complaint ?Chief Complaint  ?Patient presents with  ? Cough  ? ? ?HPI ?Dillon Rodriguez is a 21 y.o. male.  ? ?Patient is here for cough, chest congestion for several weeks.  ?No wheezing or sob noted.  ?He is sneezing.  ?No fevers/chills currently, but that was 2 weeks.  ?Minimal runny nose, congestion.  ?Taking otc meds with some help.  ?He does smoke tobacco daily.  Either cigarrettes or cigars.  ? ?Past Medical History:  ?Diagnosis Date  ? ADHD (attention deficit hyperactivity disorder)   ? Learning disability 02/02/2015  ? ODD (oppositional defiant disorder) 02/02/2015  ? Oppositional defiant disorder   ? ? ?Patient Active Problem List  ? Diagnosis Date Noted  ? Migraine without aura and without status migrainosus, not intractable 04/02/2017  ? Tension headache 04/02/2017  ? ADHD (attention deficit hyperactivity disorder), combined type 01/24/2012  ? ? ?History reviewed. No pertinent surgical history. ? ? ? ? ?Home Medications   ? ?Prior to Admission medications   ?Medication Sig Start Date End Date Taking? Authorizing Provider  ?cetirizine (ZYRTEC) 10 MG tablet Take 10 mg by mouth at bedtime. 10/18/21   [provider]  ?cloNIDine (CATAPRES) 0.2 MG tablet Take one each evening 12/15/21   Arfeen, Phillips Grout, MD  ? ? ?Family History ?Family History  ?Problem Relation Age of Onset  ? Migraines Mother   ? Stroke Maternal Grandmother   ? Stroke Maternal Grandfather   ? ? ?Social History ?Social History  ? ?Tobacco Use  ? Smoking status: Never  ? Smokeless tobacco: Never  ?Vaping Use  ? Vaping Use: Never used  ?Substance Use Topics  ? Alcohol use: No  ? Drug use: No  ? ? ? ?Allergies   ?Patient has no known allergies. ? ? ?Review of Systems ?Review of Systems  ?Constitutional: Negative.   ?HENT:  Positive for rhinorrhea. Negative for congestion and sore throat.   ?Respiratory:  Positive for cough.    ?Cardiovascular: Negative.   ?Gastrointestinal: Negative.   ?Genitourinary: Negative.   ? ? ?Physical Exam ?Triage Vital Signs ?ED Triage Vitals [01/04/22 1508]  ?Enc Vitals Group  ?   BP (!) 145/83  ?   Pulse Rate (!) 103  ?   Resp 18  ?   Temp 98.4 ?F (36.9 ?C)  ?   Temp Source Oral  ?   SpO2 97 %  ?   Weight   ?   Height   ?   Head Circumference   ?   Peak Flow   ?   Pain Score 0  ?   Pain Loc   ?   Pain Edu?   ?   Excl. in GC?   ? ?No data found. ? ?Updated Vital Signs ?BP (!) 145/83 (BP Location: Left Arm)   Pulse (!) 103   Temp 98.4 ?F (36.9 ?C) (Oral)   Resp 18   SpO2 97%  ? ?Visual Acuity ?Right Eye Distance:   ?Left Eye Distance:   ?Bilateral Distance:   ? ?Right Eye Near:   ?Left Eye Near:    ?Bilateral Near:    ? ?Physical Exam ?Constitutional:   ?   Appearance: Normal appearance.  ?HENT:  ?   Head: Normocephalic and atraumatic.  ?   Nose: Nose normal.  ?Cardiovascular:  ?   Rate and Rhythm: Normal rate  and regular rhythm.  ?Pulmonary:  ?   Effort: Pulmonary effort is normal.  ?   Breath sounds: Normal breath sounds.  ?Musculoskeletal:  ?   Cervical back: Normal range of motion and neck supple.  ?Skin: ?   General: Skin is warm.  ?Neurological:  ?   General: No focal deficit present.  ?   Mental Status: He is alert.  ?Psychiatric:     ?   Mood and Affect: Mood normal.  ? ? ? ?UC Treatments / Results  ?Labs ?(all labs ordered are listed, but only abnormal results are displayed) ?Labs Reviewed - No data to display ? ?EKG ? ? ?Radiology ?No results found. ? ?Procedures ?Procedures (including critical care time) ? ?Medications Ordered in UC ?Medications - No data to display ? ?Initial Impression / Assessment and Plan / UC Course  ?I have reviewed the triage vital signs and the nursing notes. ? ?Pertinent labs & imaging results that were available during my care of the patient were reviewed by me and considered in my medical decision making (see chart for details). ? ? ?Final Clinical Impressions(s) / UC  Diagnoses  ? ?Final diagnoses:  ?Subacute cough  ? ? ? ?Discharge Instructions   ? ?  ?You were seen today for cough.  ?Your exam is normal today.  ?This could be allergic vs viral.   ?I have sent out a medication to help with the cough.  You may take this up to three times/day.  ?You can take over the counter zyrtec or claritin to see if help with your symptoms.  ?I recommend you stop smoking as well.  ? ? ? ?ED Prescriptions   ? ? Medication Sig Dispense Auth. Provider  ? benzonatate (TESSALON) 100 MG capsule Take 1 capsule (100 mg total) by mouth every 8 (eight) hours. 21 capsule Jannifer Franklin, MD  ? ?  ? ?PDMP not reviewed this encounter. ?  ?Jannifer Franklin, MD ?01/04/22 1520 ? ?

## 2022-01-04 NOTE — ED Triage Notes (Signed)
Greater than one week h/o cough and congestion. Has been benadryl, elderberry and zytrec. ?Denies runny nose, sore throat and sinus pressure. No v/d. ?

## 2022-01-04 NOTE — Discharge Instructions (Signed)
You were seen today for cough.  ?Your exam is normal today.  ?This could be allergic vs viral.   ?I have sent out a medication to help with the cough.  You may take this up to three times/day.  ?You can take over the counter zyrtec or claritin to see if help with your symptoms.  ?I recommend you stop smoking as well.  ?

## 2022-03-14 ENCOUNTER — Telehealth (HOSPITAL_COMMUNITY): Payer: Self-pay | Admitting: *Deleted

## 2022-03-14 ENCOUNTER — Other Ambulatory Visit (HOSPITAL_COMMUNITY): Payer: Self-pay | Admitting: *Deleted

## 2022-03-14 DIAGNOSIS — F819 Developmental disorder of scholastic skills, unspecified: Secondary | ICD-10-CM

## 2022-03-14 DIAGNOSIS — F902 Attention-deficit hyperactivity disorder, combined type: Secondary | ICD-10-CM

## 2022-03-14 MED ORDER — CLONIDINE HCL 0.2 MG PO TABS: ORAL_TABLET | ORAL | 0 refills | Status: DC

## 2022-03-14 NOTE — Telephone Encounter (Signed)
Pt other called very angry stating pt needs a refill of the Clonidine 0.2 mg qhs. Last Rx sent on 12/15/21 #30 with 2 refills. Pt has an appointment scheduled for 03/19/22. Please review and advise.

## 2022-03-15 NOTE — Telephone Encounter (Signed)
Bridge refill done.

## 2022-03-19 ENCOUNTER — Telehealth (HOSPITAL_BASED_OUTPATIENT_CLINIC_OR_DEPARTMENT_OTHER): Payer: 59 | Admitting: Psychiatry

## 2022-03-19 ENCOUNTER — Encounter (HOSPITAL_COMMUNITY): Payer: Self-pay | Admitting: Psychiatry

## 2022-03-19 DIAGNOSIS — F819 Developmental disorder of scholastic skills, unspecified: Secondary | ICD-10-CM

## 2022-03-19 DIAGNOSIS — F902 Attention-deficit hyperactivity disorder, combined type: Secondary | ICD-10-CM | POA: Diagnosis not present

## 2022-03-19 MED ORDER — CLONIDINE HCL 0.2 MG PO TABS: 0.2000 mg | ORAL_TABLET | Freq: Every day | ORAL | 2 refills | Status: DC

## 2022-03-19 NOTE — Progress Notes (Signed)
Virtual Visit via Telephone Note  I connected with Dillon Rodriguez on 03/19/22 at 10:40 AM EDT by telephone and verified that I am speaking with the correct person using two identifiers.  Location: Patient: Home Provider: Home Office   I discussed the limitations, risks, security and privacy concerns of performing an evaluation and management service by telephone and the availability of in person appointments. I also discussed with the patient that there may be a patient responsible charge related to this service. The patient expressed understanding and agreed to proceed.   History of Present Illness: Patient is evaluated by phone session.  She is taking his medication before he go to sleep.  He started working at [employer] and [employer] swing shifts 40 hours a week.  He admitted he has been active and sometime does not take his meal on time.  He lost weight since the last visit.  However he feels fine and denies any anger, irritability, mood swing or suicidal thoughts.  He feels his attention and focus is good with the medication.  His job is going well and is happy.  He does not want to change the medication since it is working his attention focus and mood.  He admitted has not seen PCP in a while.  Past Psychiatric History: Reviewed. H/O ADHD LD. Seeing outpatient since 2013.  Took Adderall and Vyvanse (dont recall). Took Metadate but caused poor appetite.  Inutuniv and clonidine worked. No h/o inpatient, suicidal attempt, mania, psychosis or self abusive behavior.   Psychiatric Specialty Exam: Physical Exam  Review of Systems  Weight 130 lb (59 kg).There is no height or weight on file to calculate BMI.  General Appearance: NA  Eye Contact:  NA  Speech:  Slow  Volume:  Decreased  Mood:  Euthymic  Affect:  NA  Thought Process:  Goal Directed  Orientation:  Full (Time, Place, and Person)  Thought Content:  WDL  Suicidal Thoughts:  No  Homicidal Thoughts:  No  Memory:  Immediate;    Good Recent;   Fair Remote;   Fair  Judgement:  Intact  Insight:  Present  Psychomotor Activity:  NA  Concentration:  Concentration: Fair and Attention Span: Fair  Recall:  [employer] of Knowledge:  Good  Language:  Good  Akathisia:  No  Handed:  Right  AIMS (if indicated):     Assets:  Communication Skills Desire for Improvement Housing Resilience Social Support  ADL's:  Intact  Cognition:  WNL  Sleep:   ok with meds      Assessment and Plan: ADHD, combined type.  Learning disability.  Patient lost weight since the last visit but also feel because of his working 40 hours at [employer] and [employer] and swing shifts.  Sometimes he does not take his meal on time.  He reported his attention focus is good.  He lives with his parents and there has been no issue.  We will continue clonidine 0.2 mg at bedtime.  I encouraged to see his primary care physician if he continues to lose weight without trying.  Patient agreed with the plan.  Follow-up in 3 months.  Recommended to call us back if is any concerns about medication or having any side effects.  Follow Up Instructions:    I discussed the assessment and treatment plan with the patient. The patient was provided an opportunity to ask questions and all were answered. The patient agreed with the plan and demonstrated an understanding of the instructions.  The patient was advised to call back or seek an in-person evaluation if the symptoms worsen or if the condition fails to improve as anticipated.  Collaboration of Care: Primary Care Provider AEB notes are available in epic to review.  Encouraged to make appointment with the PCP.  Patient/Guardian was advised Release of Information must be obtained prior to any record release in order to collaborate their care with an outside provider. Patient/Guardian was advised if they have not already done so to contact the registration department to sign all necessary forms in order for [place] to  release information regarding their care.   Consent: Patient/Guardian gives verbal consent for treatment and assignment of benefits for services provided during this visit. Patient/Guardian expressed understanding and agreed to proceed.    I provided 22 minutes of non-face-to-face time during this encounter.   Cleotis Nipper, MD

## 2022-06-14 ENCOUNTER — Telehealth (HOSPITAL_COMMUNITY): Payer: Self-pay

## 2022-06-14 NOTE — Telephone Encounter (Signed)
He should have until next appointment.

## 2022-06-14 NOTE — Telephone Encounter (Signed)
Called pharmacy spoke to [place] she stated that the patient did not have any refills on file.

## 2022-06-14 NOTE — Telephone Encounter (Signed)
Patients mother called to request a refill  Last appt 03/19/22 Next appt 06/18/22  cloNIDine (CATAPRES) 0.2 MG tablet 30 tablet 2 03/19/2022    Sig - Route: Take 1 tablet (0.2 mg total) by mouth at bedtime. - Oral   Sent to pharmacy as: cloNIDine (CATAPRES) 0.2 MG tablet   E-Prescribing Status: Receipt confirmed by pharmacy (03/19/2022 10:59 AM EDT)

## 2022-06-18 ENCOUNTER — Telehealth (HOSPITAL_BASED_OUTPATIENT_CLINIC_OR_DEPARTMENT_OTHER): Payer: 59 | Admitting: Psychiatry

## 2022-06-18 ENCOUNTER — Encounter (HOSPITAL_COMMUNITY): Payer: Self-pay | Admitting: Psychiatry

## 2022-06-18 DIAGNOSIS — F902 Attention-deficit hyperactivity disorder, combined type: Secondary | ICD-10-CM

## 2022-06-18 DIAGNOSIS — F819 Developmental disorder of scholastic skills, unspecified: Secondary | ICD-10-CM

## 2022-06-18 MED ORDER — CLONIDINE HCL 0.2 MG PO TABS: 0.2000 mg | ORAL_TABLET | Freq: Every day | ORAL | 3 refills | Status: DC

## 2022-06-18 NOTE — Progress Notes (Signed)
Virtual Visit via Telephone Note  I connected with Dillon Rodriguez on 06/18/22 at 10:20 AM EDT by telephone and verified that I am speaking with the correct person using two identifiers.  Location: Patient: Work Provider: Biomedical scientist   I discussed the limitations, risks, security and privacy concerns of performing an evaluation and management service by telephone and the availability of in person appointments. I also discussed with the patient that there may be a patient responsible charge related to this service. The patient expressed understanding and agreed to proceed.   History of Present Illness: Patient is evaluated by phone session.  He is taking clonidine every night because helping his sleep and his hyperactivity.  He is working at [employer] swing shifts.  He lives with his mother and things are going very well.  He has no issues.  He denies any agitation, anger, irritability.  He started working out and he had lost weight since the last visit.  He had blood work earlier this year and he was told he is prediabetic but repeat blood work was normal.  He denies any hallucination, paranoia, suicidal thoughts.  He denies drinking or using any illegal substances.  Like to keep his current medication.  Past Psychiatric History: Reviewed. H/O ADHD LD. Seeing outpatient since 2013.  Took Adderall and Vyvanse (dont recall). Took Metadate but caused poor appetite.  Inutuniv and clonidine worked. No h/o inpatient, suicidal attempt, mania, psychosis or self abusive behavior.   Psychiatric Specialty Exam: Physical Exam  Review of Systems  Weight 121 lb (54.9 kg).There is no height or weight on file to calculate BMI.  General Appearance: NA  Eye Contact:  NA  Speech:  Slow  Volume:  Normal  Mood:  Euthymic  Affect:  NA  Thought Process:  Goal Directed  Orientation:  Full (Time, Place, and Person)  Thought Content:  Logical  Suicidal Thoughts:  No  Homicidal Thoughts:  No  Memory:   Immediate;   Good Recent;   Fair Remote;   Fair  Judgement:  Intact  Insight:  Shallow  Psychomotor Activity:  Normal  Concentration:  Concentration: Fair and Attention Span: Fair  Recall:  [employer] of Knowledge:  Good  Language:  Good  Akathisia:  No  Handed:  Right  AIMS (if indicated):     Assets:  Communication Skills Desire for Improvement Housing Social Support Transportation  ADL's:  Intact  Cognition:  WNL  Sleep:   ok      Assessment and Plan: ADHD, combined type.  Learning disability.  Patient is stable on his current medication.  He had lost another 3 pounds since the last visit.  I encouraged should contact his PCP to have his blood work drawn.  Patient promised to do it.  Continue clonidine 0.2 mg at bedtime.  Recommended to call us back if he has any question or any concern.  Follow-up in 3 months.  Follow Up Instructions:    I discussed the assessment and treatment plan with the patient. The patient was provided an opportunity to ask questions and all were answered. The patient agreed with the plan and demonstrated an understanding of the instructions.   The patient was advised to call back or seek an in-person evaluation if the symptoms worsen or if the condition fails to improve as anticipated.  Collaboration of Care: Primary Care Provider AEB patient was encouraged to make appointment with PCP.  Patient/Guardian was advised Release of Information must be obtained prior  to any record release in order to collaborate their care with an outside provider. Patient/Guardian was advised if they have not already done so to contact the registration department to sign all necessary forms in order for [place] to release information regarding their care.   Consent: Patient/Guardian gives verbal consent for treatment and assignment of benefits for services provided during this visit. Patient/Guardian expressed understanding and agreed to proceed.    I provided 15 minutes  of non-face-to-face time during this encounter.   Kathlee Nations, MD

## 2022-08-21 ENCOUNTER — Ambulatory Visit (HOSPITAL_COMMUNITY)
Admission: RE | Admit: 2022-08-21 | Discharge: 2022-08-21 | Disposition: A | Discharge status: Home / Self Care | Payer: 59 | Source: Ambulatory Visit | Attending: Emergency Medicine | Admitting: Emergency Medicine

## 2022-08-21 ENCOUNTER — Encounter (HOSPITAL_COMMUNITY): Payer: Self-pay

## 2022-08-21 VITALS — BP 130/71 | HR 88 | Temp 99.5°F | Resp 18

## 2022-08-21 DIAGNOSIS — J029 Acute pharyngitis, unspecified: Secondary | ICD-10-CM | POA: Diagnosis not present

## 2022-08-21 DIAGNOSIS — J358 Other chronic diseases of tonsils and adenoids: Secondary | ICD-10-CM | POA: Diagnosis not present

## 2022-08-21 DIAGNOSIS — H9313 Tinnitus, bilateral: Secondary | ICD-10-CM | POA: Diagnosis not present

## 2022-08-21 LAB — POCT RAPID STREP A, ED / UC: Streptococcus, Group A Screen (Direct): NEGATIVE

## 2022-08-21 MED ORDER — LIDOCAINE VISCOUS HCL 2 % MT SOLN
OROMUCOSAL | Status: AC
  Filled 2022-08-21: qty 15

## 2022-08-21 MED ORDER — LIDOCAINE VISCOUS HCL 2 % MT SOLN
15.0000 mL | Freq: Once | OROMUCOSAL | Status: AC
  Administered 2022-08-21: 15 mL via OROMUCOSAL

## 2022-08-21 MED ORDER — LIDOCAINE VISCOUS HCL 2 % MT SOLN: 15.0000 mL | OROMUCOSAL | 0 refills | Status: DC | PRN

## 2022-08-21 MED ORDER — IBUPROFEN 800 MG PO TABS
800.0000 mg | ORAL_TABLET | Freq: Once | ORAL | Status: AC
  Administered 2022-08-21: 800 mg via ORAL

## 2022-08-21 MED ORDER — IBUPROFEN 800 MG PO TABS
ORAL_TABLET | ORAL | Status: AC
  Filled 2022-08-21: qty 1

## 2022-08-21 NOTE — ED Triage Notes (Signed)
Pt states that he is having left ear pain and ringing x 2 weeks, sore throat. He has taken tylenol, vicks vapor meds, honey and tea these have helped but only temp. Pt denies any fever.   Father wants strep test.

## 2022-08-21 NOTE — ED Provider Notes (Signed)
[facility]    CSN: [id number] Arrival date & time: 08/21/22  1019     History   Chief Complaint Chief Complaint  Patient presents with   Ear Fullness    sore throat - Entered by patient   Sore Throat    HPI Dillon Rodriguez is a 21 y.o. male.  Presents with 2-week history of sore throat, worsening over the last several days.  Reports 10/10 pain today Has tried Tylenol No medications given today Dad would like a strep test  Additionally reports 2-week history of ear pain and ringing in the ears Denies any nasal congestion or drainage.  No cough. No fevers.  No known sick contacts, he does not attend school or work   Past Medical History:  Diagnosis Date   ADHD (attention deficit hyperactivity disorder)    Learning disability 02/02/2015   ODD (oppositional defiant disorder) 02/02/2015   Oppositional defiant disorder     Patient Active Problem List   Diagnosis Date Noted   Migraine without aura and without status migrainosus, not intractable 04/02/2017   Tension headache 04/02/2017   ADHD (attention deficit hyperactivity disorder), combined type 01/24/2012    History reviewed. No pertinent surgical history.     Home Medications    Prior to Admission medications   Medication Sig Start Date End Date Taking? Authorizing Provider  cloNIDine (CATAPRES) 0.2 MG tablet Take 1 tablet (0.2 mg total) by mouth at bedtime. 06/18/22  Yes Arfeen, Phillips Grout, MD  lidocaine (XYLOCAINE) 2 % solution Use as directed 15 mLs in the mouth or throat every 3 (three) hours as needed for mouth pain. Gargle and spit 08/21/22  Yes Damara Klunder, Lurena Joiner, PA-C    Family History Family History  Problem Relation Age of Onset   Migraines Mother    Stroke Maternal Grandmother    Stroke Maternal Grandfather     Social History Social History   Tobacco Use   Smoking status: Some Days    Types: Cigars   Smokeless tobacco: Never  Vaping Use   Vaping Use: Never used  Substance Use  Topics   Alcohol use: Yes   Drug use: No     Allergies   Patient has no known allergies.   Review of Systems Review of Systems  As per HPI  Physical Exam Triage Vital Signs ED Triage Vitals  Enc Vitals Group     BP 08/21/22 1045 130/71     Pulse Rate 08/21/22 1045 88     Resp 08/21/22 1045 18     Temp 08/21/22 1045 99.5 F (37.5 C)     Temp Source 08/21/22 1045 Oral     SpO2 08/21/22 1045 97 %     Weight --      Height --      Head Circumference --      Peak Flow --      Pain Score 08/21/22 1042 10     Pain Loc --      Pain Edu? --      Excl. in GC? --    No data found.  Updated Vital Signs BP 130/71 (BP Location: Right Arm)   Pulse 88   Temp 99.5 F (37.5 C) (Oral)   Resp 18   SpO2 97%      Physical Exam Vitals and nursing note reviewed.  Constitutional:      General: He is not in acute distress.    Appearance: He is well-developed. He is not ill-appearing.  Comments: Tolerating own secretions, normal phonation  HENT:     Right Ear: Tympanic membrane and ear canal normal.     Left Ear: Tympanic membrane and ear canal normal.     Ears:     Comments: No sign of infection or irritation. TMs are intact    Mouth/Throat:     Mouth: Mucous membranes are moist.     Pharynx: Oropharynx is clear. Uvula midline. Posterior oropharyngeal erythema present.     Tonsils: No tonsillar exudate or tonsillar abscesses. 1+ on the right. 3+ on the left.  Cardiovascular:     Rate and Rhythm: Normal rate and regular rhythm.     Pulses: Normal pulses.     Heart sounds: Normal heart sounds.  Pulmonary:     Effort: Pulmonary effort is normal.     Breath sounds: Normal breath sounds.  Abdominal:     Tenderness: There is no abdominal tenderness.  Musculoskeletal:     Cervical back: Normal range of motion. No rigidity.  Lymphadenopathy:     Cervical: No cervical adenopathy.  Skin:    General: Skin is warm and dry.     Findings: No rash.  Neurological:     Mental  Status: He is alert and oriented to person, place, and time.     UC Treatments / Results  Labs (all labs ordered are listed, but only abnormal results are displayed) Labs Reviewed  CULTURE, GROUP A STREP [facility])  POCT RAPID STREP A, ED / UC    EKG  Radiology No results found.  Procedures Procedures (including critical care time)  Medications Ordered in UC Medications  lidocaine (XYLOCAINE) 2 % viscous mouth solution 15 mL (15 mLs Mouth/Throat Given 08/21/22 1137)  ibuprofen (ADVIL) tablet 800 mg (800 mg Oral Given 08/21/22 1136)    Initial Impression / Assessment and Plan / UC Course  I have reviewed the triage vital signs and the nursing notes.  Pertinent labs & imaging results that were available during my care of the patient were reviewed by me and considered in my medical decision making (see chart for details).  Strep negative, will culture No signs of PTA on exam, but left tonsil significantly larger than the R For this I recommend follow up with ENT for further evaluation. They can also evaluate his tinnitus. No sign of ear or sinus infection today. Lidocaine gargle given with improvement in throat pain Ibuprofen dose given in clinic. Discussed viral etiology, symptomatic care, continuing ibuprofen and lido gargle as needed Patient agrees to plan  Final Clinical Impressions(s) / UC Diagnoses   Final diagnoses:  Viral pharyngitis  Tinnitus of both ears     Discharge Instructions      As discussed you can use the lidocaine gargle and spit as often as needed to numb the throat.  Additionally ibuprofen up to 800 mg can be used every 6 hours for pain control.  Make sure you are drinking lots of fluids.  Please follow-up with the ear nose and throat specialist regarding your tonsils and ringing in the ears.  You can call them to make an appointment.    ED Prescriptions     Medication Sig Dispense Auth. Provider   lidocaine (XYLOCAINE) 2 % solution Use as  directed 15 mLs in the mouth or throat every 3 (three) hours as needed for mouth pain. Gargle and spit 100 mL Trish Mancinelli, Lurena Joiner, PA-C      PDMP not reviewed this encounter.   Mckynzie Liwanag, Lurena Joiner, PA-C 08/21/22 1213

## 2022-08-21 NOTE — Discharge Instructions (Addendum)
As discussed you can use the lidocaine gargle and spit as often as needed to numb the throat.  Additionally ibuprofen up to 800 mg can be used every 6 hours for pain control.  Make sure you are drinking lots of fluids.  Please follow-up with the ear nose and throat specialist regarding your tonsils and ringing in the ears.  You can call them to make an appointment.

## 2022-08-22 LAB — CULTURE, GROUP A STREP (THRC)

## 2022-08-23 ENCOUNTER — Telehealth (HOSPITAL_COMMUNITY): Payer: Self-pay | Admitting: Emergency Medicine

## 2022-08-23 MED ORDER — AMOXICILLIN 500 MG PO CAPS: 500.0000 mg | ORAL_CAPSULE | Freq: Two times a day (BID) | ORAL | 0 refills | Status: AC

## 2022-09-17 ENCOUNTER — Encounter (HOSPITAL_COMMUNITY): Payer: Self-pay | Admitting: Psychiatry

## 2022-09-17 ENCOUNTER — Telehealth (HOSPITAL_BASED_OUTPATIENT_CLINIC_OR_DEPARTMENT_OTHER): Payer: Medicaid Other | Admitting: Psychiatry

## 2022-09-17 DIAGNOSIS — F902 Attention-deficit hyperactivity disorder, combined type: Secondary | ICD-10-CM | POA: Diagnosis not present

## 2022-09-17 DIAGNOSIS — F819 Developmental disorder of scholastic skills, unspecified: Secondary | ICD-10-CM | POA: Diagnosis not present

## 2022-09-17 MED ORDER — CLONIDINE HCL 0.2 MG PO TABS: 0.2000 mg | ORAL_TABLET | Freq: Every day | ORAL | 3 refills | Status: DC

## 2022-09-17 NOTE — Progress Notes (Signed)
Virtual Visit via Telephone Note  I connected with Dillon Rodriguez on 09/17/22 at 10:40 AM EST by telephone and verified that I am speaking with the correct person using two identifiers.  Location: Patient: Home Provider: Home Office   I discussed the limitations, risks, security and privacy concerns of performing an evaluation and management service by telephone and the availability of in person appointments. I also discussed with the patient that there may be a patient responsible charge related to this service. The patient expressed understanding and agreed to proceed.   History of Present Illness: Patient is evaluated by phone session.  He is compliant with clonidine every pill is helping his hyperactivity, sleep, anxiety.  He continues to work swing shift at [employer].  He had a good Christmas.  He did go to visit family member and had a good time.  He denies any mania, anger, agitation.  His appetite is okay.  His weight is unchanged from the past.  Denies any hallucination or any suicidal thoughts.  Like to continue his current medication.  He denies drinking or using any illegal substances.  He lives with his mother.  Past Psychiatric History: Reviewed. H/O ADHD LD. Seeing outpatient since 2013.  Took Adderall and Vyvanse (dont recall). Took Metadate but caused poor appetite.  Inutuniv and clonidine worked. No h/o inpatient, suicidal attempt, mania, psychosis or self abusive behavior.   Psychiatric Specialty Exam: Physical Exam  Review of Systems  Weight 120 lb (54.4 kg).There is no height or weight on file to calculate BMI.  General Appearance: NA  Eye Contact:  NA  Speech:  Normal Rate  Volume:  Normal  Mood:  Euthymic  Affect:  NA  Thought Process:  Descriptions of Associations: Intact  Orientation:  Full (Time, Place, and Person)  Thought Content:  Logical  Suicidal Thoughts:  No  Homicidal Thoughts:  No  Memory:  Immediate;   Good Recent;   Fair Remote;   Fair   Judgement:  Fair  Insight:  Shallow  Psychomotor Activity:  NA  Concentration:  Concentration: Fair and Attention Span: Fair  Recall:  [employer] of Knowledge:  Good  Language:  Good  Akathisia:  No  Handed:  Right  AIMS (if indicated):     Assets:  Communication Skills Desire for Improvement Housing Social Support Transportation  ADL's:  Intact  Cognition:  WNL  Sleep:   ok      Assessment and Plan: ADHD, combined type.  Learning disability.  Patient is stable on his current medication.  Continue clonidine 0.2 mg at bedtime.  Discussed medication side effects and benefits.  Recommend to call us back if is any question or any concern.  Follow-up in 3 months.  Follow Up Instructions:    I discussed the assessment and treatment plan with the patient. The patient was provided an opportunity to ask questions and all were answered. The patient agreed with the plan and demonstrated an understanding of the instructions.   The patient was advised to call back or seek an in-person evaluation if the symptoms worsen or if the condition fails to improve as anticipated.  Collaboration of Care: Other provider involved in patient's care AEB notes are available in epic to review.  Patient/Guardian was advised Release of Information must be obtained prior to any record release in order to collaborate their care with an outside provider. Patient/Guardian was advised if they have not already done so to contact the registration department to sign all  necessary forms in order for [place] to release information regarding their care.   Consent: Patient/Guardian gives verbal consent for treatment and assignment of benefits for services provided during this visit. Patient/Guardian expressed understanding and agreed to proceed.    I provided 18 minutes of non-face-to-face time during this encounter.   Kathlee Nations, MD

## 2022-12-17 ENCOUNTER — Telehealth (HOSPITAL_COMMUNITY): Payer: 59 | Admitting: Psychiatry

## 2022-12-26 ENCOUNTER — Telehealth (HOSPITAL_BASED_OUTPATIENT_CLINIC_OR_DEPARTMENT_OTHER): Payer: 59 | Admitting: Psychiatry

## 2022-12-26 ENCOUNTER — Encounter (HOSPITAL_COMMUNITY): Payer: Self-pay | Admitting: Psychiatry

## 2022-12-26 DIAGNOSIS — F902 Attention-deficit hyperactivity disorder, combined type: Secondary | ICD-10-CM | POA: Diagnosis not present

## 2022-12-26 DIAGNOSIS — F819 Developmental disorder of scholastic skills, unspecified: Secondary | ICD-10-CM | POA: Diagnosis not present

## 2022-12-26 MED ORDER — CLONIDINE HCL 0.2 MG PO TABS: 0.2000 mg | ORAL_TABLET | Freq: Every day | ORAL | 3 refills | Status: DC

## 2022-12-26 NOTE — Progress Notes (Signed)
[facility] MD Virtual Progress Note   Patient Location: Home Provider Location: Home Office  I connect with patient by telephone and verified that I am speaking with correct person by using two identifiers. I discussed the limitations of evaluation and management by telemedicine and the availability of in person appointments. I also discussed with the patient that there may be a patient responsible charge related to this service. The patient expressed understanding and agreed to proceed.  Dillon Rodriguez [id number] 22 y.o.  12/26/2022 11:04 AM  History of Present Illness:  Patient is evaluated by phone session.  He is taking clonidine at bedtime which helps his sleep.  Patient apologized missing last appointment.  He reported things are going okay but I asked about his job patient told he did not pass his probation after 6 months working at [employer].  Patient did not want to go to details but he feel currently financially stable and not really interested to find another job.  He then asked if he ever got a job at [employer] then can he stop taking the medication.  I explained he is taking the medication for his symptoms and if he kept the job he can still take the medicine as far as it is helping his symptoms.  He denies any crying spells or any feeling of hopelessness or worthlessness.  He denies any suicidal thoughts or homicidal thoughts.  He is trying to gain weight and recently he started taking vitamins to help his energy.  He has no tremor or shakes or any EPS.  He admitted drinking beer every other day but denies any intoxication, blackouts.  He lives with his mother.  He reported getting along with her mother very well and there has been no recent behavior problems.  He does not have a primary care and if he gets sick he is to go to local urgent care.  Past Psychiatric History: H/O ADHD LD. Seeing outpatient since 2013.  Took Adderall and Vyvanse (dont  recall). Took Metadate but caused poor appetite.  Inutuniv and clonidine worked. No h/o inpatient, suicidal attempt, mania, psychosis or self abusive behavior.    Outpatient Encounter Medications as of 12/26/2022  Medication Sig   cloNIDine (CATAPRES) 0.2 MG tablet Take 1 tablet (0.2 mg total) by mouth at bedtime.   lidocaine (XYLOCAINE) 2 % solution Use as directed 15 mLs in the mouth or throat every 3 (three) hours as needed for mouth pain. Gargle and spit   No facility-administered encounter medications on file as of 12/26/2022.    No results found for this or any previous visit (from the past 2160 hour(s)).   Psychiatric Specialty Exam: Physical Exam  Review of Systems  Weight 125 lb (56.7 kg).There is no height or weight on file to calculate BMI.  General Appearance: NA  Eye Contact:  NA  Speech:  Slow  Volume:  Decreased  Mood:  Dysphoric  Affect:  NA  Thought Process:  Descriptions of Associations: Intact  Orientation:  Full (Time, Place, and Person)  Thought Content:  Rumination  Suicidal Thoughts:  No  Homicidal Thoughts:  No  Memory:  Immediate;   Good Recent;   Fair Remote;   Fair  Judgement:  Fair  Insight:  Shallow  Psychomotor Activity:  Decreased  Concentration:  Concentration: Fair and Attention Span: Fair  Recall:  [employer] of Knowledge:  Fair  Language:  Good  Akathisia:  No  Handed:  Right  AIMS (if  indicated):     Assets:  Communication Skills Desire for Improvement Housing Social Support Transportation  ADL's:  Intact  Cognition:  WNL  Sleep:  ok     Assessment/Plan: Learning disability  ADHD (attention deficit hyperactivity disorder), combined type  Patient is not working anymore.  He is not looking for a job because he reported financially he is stable.  He did not go into detail why he could not pass the probation after 6 months.  He asked about future of taking medication and I explained with answers and if he had any concerns.  So far  he is tolerating medication and reported no tremor or shakes or any EPS.  He sleeps good.  Continue clonidine 0.2 mg at bedtime.  He has no primary care doctor and I encourage should consider getting routine blood work and physical health checkup.  Discussed medication side effects and benefits.  He is not interested in therapy.  Follow up in 4 months.   Follow Up Instructions:     I discussed the assessment and treatment plan with the patient. The patient was provided an opportunity to ask questions and all were answered. The patient agreed with the plan and demonstrated an understanding of the instructions.   The patient was advised to call back or seek an in-person evaluation if the symptoms worsen or if the condition fails to improve as anticipated.    Collaboration of Care: Other provider involved in patient's care AEB notes are available in epic to review.  Patient/Guardian was advised Release of Information must be obtained prior to any record release in order to collaborate their care with an outside provider. Patient/Guardian was advised if they have not already done so to contact the registration department to sign all necessary forms in order for [place] to release information regarding their care.   Consent: Patient/Guardian gives verbal consent for treatment and assignment of benefits for services provided during this visit. Patient/Guardian expressed understanding and agreed to proceed.     I provided 19 minutes of non face to face time during this encounter.  Note: This document was prepared by [employer] voice dictation technology and any errors that results from this process are unintentional.    Cleotis Nipper, MD 12/26/2022

## 2023-04-29 ENCOUNTER — Encounter (HOSPITAL_COMMUNITY): Payer: Self-pay | Admitting: Psychiatry

## 2023-04-29 ENCOUNTER — Telehealth (HOSPITAL_BASED_OUTPATIENT_CLINIC_OR_DEPARTMENT_OTHER): Payer: 59 | Admitting: Psychiatry

## 2023-04-29 VITALS — Wt 125.0 lb

## 2023-04-29 DIAGNOSIS — F819 Developmental disorder of scholastic skills, unspecified: Secondary | ICD-10-CM | POA: Diagnosis not present

## 2023-04-29 DIAGNOSIS — F902 Attention-deficit hyperactivity disorder, combined type: Secondary | ICD-10-CM

## 2023-04-29 MED ORDER — CLONIDINE HCL 0.2 MG PO TABS: 0.2000 mg | ORAL_TABLET | Freq: Every day | ORAL | 3 refills | Status: DC

## 2023-04-29 NOTE — Progress Notes (Signed)
[facility] MD Virtual Progress Note   Patient Location: Home Provider Location: Home Office  I connect with patient by telephone and verified that I am speaking with correct person by using two identifiers. I discussed the limitations of evaluation and management by telemedicine and the availability of in person appointments. I also discussed with the patient that there may be a patient responsible charge related to this service. The patient expressed understanding and agreed to proceed.  Dillon Rodriguez [id number] 22 y.o.  04/29/2023 10:10 AM  History of Present Illness:  Patient is evaluated by phone session.  He has my chart but has not activated yet.  He promised to review session on his next appointment.  Overall he has been doing well.  He sleeps good.  He reported his hyperactivity is much better and sometime he thinks to come off from the medication.  However he is not ready her symptoms are under control.  He is looking into job and trying to update his resume so he can apply to the places.  He lives with his mother.  He admitted drinking beer every other day but denies any intoxication, blackouts, tremors or shakes.  He admitted clonidine helps his mood and keep him calm down.  He is getting along with his mother very well.  There has been no recent argument.  His appetite is okay.  His weight is stable.  He has no established primary care and when he need to see the doctor and get sick he usually go to local urgent care.  He has no tremor or shakes or any EPS.  Past Psychiatric History: H/O ADHD LD. Seeing outpatient since 2013.  Took Adderall and Vyvanse (dont recall). Took Metadate but caused poor appetite.  Inutuniv and clonidine worked. No h/o inpatient, suicidal attempt, mania, psychosis or self abusive behavior.    Outpatient Encounter Medications as of 04/29/2023  Medication Sig   cloNIDine (CATAPRES) 0.2 MG tablet Take 1 tablet (0.2 mg total) by mouth at bedtime.    lidocaine (XYLOCAINE) 2 % solution Use as directed 15 mLs in the mouth or throat every 3 (three) hours as needed for mouth pain. Gargle and spit   No facility-administered encounter medications on file as of 04/29/2023.    No results found for this or any previous visit (from the past 2160 hour(s)).   Psychiatric Specialty Exam: Physical Exam  Review of Systems  Weight 125 lb (56.7 kg).There is no height or weight on file to calculate BMI.  General Appearance: NA  Eye Contact:  NA  Speech:  Slow  Volume:  Normal  Mood:  Euthymic  Affect:  NA  Thought Process:  Goal Directed  Orientation:  Full (Time, Place, and Person)  Thought Content:  WDL  Suicidal Thoughts:  No  Homicidal Thoughts:  No  Memory:  Immediate;   Fair Recent;   Fair Remote;   Fair  Judgement:  Fair  Insight:  Shallow  Psychomotor Activity:  NA  Concentration:  Concentration: Fair and Attention Span: Fair  Recall:  Good  Fund of Knowledge:  Good  Language:  Good  Akathisia:  No  Handed:  Right  AIMS (if indicated):     Assets:  Communication Skills Desire for Improvement Social Support Transportation  ADL's:  Intact  Cognition:  WNL  Sleep:  ok     Assessment/Plan: ADHD (attention deficit hyperactivity disorder), combined type - Plan: cloNIDine (CATAPRES) 0.2 MG tablet  Learning disability - Plan: cloNIDine (CATAPRES) 0.2  MG tablet  Patient is stable on low-dose clonidine.  His hyperactivity is under control.  He is taking in the future may want to come off from the medication but he is not ready as of today.  He like to continue current dose of clonidine 0.2 mg at bedtime.  He is also looking into job and hoping to find a job that is suitable for you.  Discussed medication side effects and benefits.  Continue clonidine 0.2 mg at bedtime.  Patient has no PCP and I encouraged him to have a routine blood work and physical checkup.  I also encouraged to have a mild chart video activated so he can do  video session next time.  Patient acknowledged and agreed.  He is not interested in therapy.  Follow-up in 4 months   Follow Up Instructions:     I discussed the assessment and treatment plan with the patient. The patient was provided an opportunity to ask questions and all were answered. The patient agreed with the plan and demonstrated an understanding of the instructions.   The patient was advised to call back or seek an in-person evaluation if the symptoms worsen or if the condition fails to improve as anticipated.    Collaboration of Care: Other provider involved in patient's care AEB notes are available in epic to review.  Patient/Guardian was advised Release of Information must be obtained prior to any record release in order to collaborate their care with an outside provider. Patient/Guardian was advised if they have not already done so to contact the registration department to sign all necessary forms in order for [place] to release information regarding their care.   Consent: Patient/Guardian gives verbal consent for treatment and assignment of benefits for services provided during this visit. Patient/Guardian expressed understanding and agreed to proceed.     I provided 18 minutes of non face to face time during this encounter.  Note: This document was prepared by [employer] voice dictation technology and any errors that results from this process are unintentional.    Cleotis Nipper, MD 04/29/2023

## 2023-06-14 ENCOUNTER — Ambulatory Visit (HOSPITAL_COMMUNITY)
Admission: EM | Admit: 2023-06-14 | Discharge: 2023-06-14 | Disposition: A | Discharge status: Home / Self Care | Payer: 59 | Attending: Internal Medicine | Admitting: Internal Medicine

## 2023-06-14 ENCOUNTER — Encounter (HOSPITAL_COMMUNITY): Payer: Self-pay | Admitting: Emergency Medicine

## 2023-06-14 DIAGNOSIS — J309 Allergic rhinitis, unspecified: Secondary | ICD-10-CM

## 2023-06-14 NOTE — ED Triage Notes (Signed)
Reports that for past couple months had intermittent nasal congestion. Reports that sometimes will cough up phlegm that is clear. Pt reports father concerned if related to him sleeping under the vent.

## 2023-06-14 NOTE — ED Provider Notes (Signed)
[facility]    CSN: [id number] Arrival date & time: 06/14/23  1149      History   Chief Complaint Chief Complaint  Patient presents with   Nasal Congestion    HPI Dillon Rodriguez is a 22 y.o. male.   The history is provided by the patient.   Congestion for a few months, feels postnasal drainage, had not episode of vomiting several days ago where he vomited phlegm.  Past Medical History:  Diagnosis Date   ADHD (attention deficit hyperactivity disorder)    Learning disability 02/02/2015   ODD (oppositional defiant disorder) 02/02/2015   Oppositional defiant disorder     Patient Active Problem List   Diagnosis Date Noted   Migraine without aura and without status migrainosus, not intractable 04/02/2017   Tension headache 04/02/2017   ADHD (attention deficit hyperactivity disorder), combined type 01/24/2012    History reviewed. No pertinent surgical history.     Home Medications    Prior to Admission medications   Medication Sig Start Date End Date Taking? Authorizing Provider  cloNIDine (CATAPRES) 0.2 MG tablet Take 1 tablet (0.2 mg total) by mouth at bedtime. 04/29/23   Arfeen, Phillips Grout, MD  lidocaine (XYLOCAINE) 2 % solution Use as directed 15 mLs in the mouth or throat every 3 (three) hours as needed for mouth pain. Gargle and spit 08/21/22   Rising, Lurena Joiner, PA-C    Family History Family History  Problem Relation Age of Onset   Migraines Mother    Stroke Maternal Grandmother    Stroke Maternal Grandfather     Social History Social History   Tobacco Use   Smoking status: Some Days    Types: Cigars   Smokeless tobacco: Never  Vaping Use   Vaping status: Never Used  Substance Use Topics   Alcohol use: Yes   Drug use: No     Allergies   Patient has no known allergies.   Review of Systems Review of Systems  Constitutional:  Negative for appetite change, chills, fatigue and fever.  HENT:  Positive for congestion, postnasal drip and  rhinorrhea. Negative for ear discharge, sinus pressure, trouble swallowing and voice change.   Respiratory:  Positive for cough (Occasional).   Cardiovascular:  Negative for chest pain.  Gastrointestinal:  Positive for vomiting (1 episode several days ago after drinking 4 beers, states he vomited up "cold"). Negative for diarrhea and nausea.     Physical Exam Triage Vital Signs ED Triage Vitals [06/14/23 1223]  Encounter Vitals Group     BP 104/68     Systolic BP Percentile      Diastolic BP Percentile      Pulse Rate 65     Resp 14     Temp 98.3 F (36.8 C)     Temp Source Oral     SpO2 98 %     Weight      Height      Head Circumference      Peak Flow      Pain Score 0     Pain Loc      Pain Education      Exclude from Growth Chart    No data found.  Updated Vital Signs BP 104/68 (BP Location: Left Arm)   Pulse 65   Temp 98.3 F (36.8 C) (Oral)   Resp 14   SpO2 98%   Visual Acuity Right Eye Distance:   Left Eye Distance:   Bilateral Distance:    Right  Eye Near:   Left Eye Near:    Bilateral Near:     Physical Exam Vitals and nursing note reviewed.  Constitutional:      Appearance: He is not ill-appearing.  HENT:     Head: Normocephalic and atraumatic.     Right Ear: Tympanic membrane and ear canal normal.     Left Ear: Tympanic membrane and ear canal normal.     Nose: Congestion present.     Comments: Boggy nasal mucosa with clear rhinorrhea Eyes:     Conjunctiva/sclera: Conjunctivae normal.  Cardiovascular:     Rate and Rhythm: Normal rate and regular rhythm.  Pulmonary:     Effort: Pulmonary effort is normal. No respiratory distress.     Breath sounds: Normal breath sounds. No wheezing, rhonchi or rales.  Musculoskeletal:     Cervical back: Neck supple.  Lymphadenopathy:     Cervical: No cervical adenopathy.  Skin:    General: Skin is warm and dry.  Neurological:     Mental Status: He is alert and oriented to person, place, and time.       UC Treatments / Results  Labs (all labs ordered are listed, but only abnormal results are displayed) Labs Reviewed - No data to display  EKG   Radiology No results found.  Procedures Procedures (including critical care time)  Medications Ordered in UC Medications - No data to display  Initial Impression / Assessment and Plan / UC Course  I have reviewed the triage vital signs and the nursing notes.  Pertinent labs & imaging results that were available during my care of the patient were reviewed by me and considered in my medical decision making (see chart for details).     Chronic nasal congestion associated with postnasal drip worse at night, episode of vomiting several days ago.  Over-the-counter remedies tried.  Exam consistent with allergic rhinitis recommend OTC antihistamine and Flonase, follow-up as needed   Final Clinical Impressions(s) / UC Diagnoses   Final diagnoses:  Allergic rhinitis, unspecified seasonality, unspecified trigger     Discharge Instructions      Over the counter antihistamine such as Claritin, Allegra or Zyrtec as directed on the package daily.  Over-the-counter Flonase 2 sprays each nostril once daily. Follow-up with your doctor if your symptoms fail to improve or if you develop new symptoms such as fever, sore throat, chest pain, shortness of breath     ED Prescriptions   None    PDMP not reviewed this encounter.   Meliton Rattan, [place] 06/14/23 1252

## 2023-06-14 NOTE — Discharge Instructions (Signed)
Over the counter antihistamine such as Claritin, Allegra or Zyrtec as directed on the package daily.  Over-the-counter Flonase 2 sprays each nostril once daily. Follow-up with your doctor if your symptoms fail to improve or if you develop new symptoms such as fever, sore throat, chest pain, shortness of breath

## 2023-08-29 ENCOUNTER — Encounter (HOSPITAL_COMMUNITY): Payer: Self-pay | Admitting: Psychiatry

## 2023-08-29 ENCOUNTER — Telehealth (HOSPITAL_COMMUNITY): Payer: 59 | Admitting: Psychiatry

## 2023-08-29 DIAGNOSIS — F902 Attention-deficit hyperactivity disorder, combined type: Secondary | ICD-10-CM

## 2023-08-29 DIAGNOSIS — F819 Developmental disorder of scholastic skills, unspecified: Secondary | ICD-10-CM | POA: Diagnosis not present

## 2023-08-29 MED ORDER — CLONIDINE HCL 0.2 MG PO TABS: 0.2000 mg | ORAL_TABLET | Freq: Every day | ORAL | 3 refills | Status: DC

## 2023-08-29 NOTE — Progress Notes (Signed)
[facility] MD Virtual Progress Note   Patient Location: Home Provider Location: Home   I connect with patient by Video and verified that I am speaking with correct person by using two identifiers. I discussed the limitations of evaluation and management by telemedicine and the availability of in person appointments. I also discussed with the patient that there may be a patient responsible charge related to this service. The patient expressed understanding and agreed to proceed.  Dillon Rodriguez [id number] 22 y.o.  08/29/2023 10:31 AM  History of Present Illness:  Patient is evaluated by video session.  He is doing well on clonidine.  He admitted that when he is not doing anything then he thinks a lot and feels restless but overall medicine is working for his ADHD symptoms.  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  He reported job is going so far okay and there is no stress.  It is a easy job.  He has no tremor or shakes or any EPS.  He lives with his mother.  He denies any anger, mood swing, irritability or any hallucination.  When he is working his focus but when he is not working he sometimes feel his mind is wandering.  He wants to keep the current medication.  He admitted not able to have seen his primary care in a while and no physical done.  He was seen in the emergency room in October for nasal congestion.  No labs were drawn.  Past Psychiatric History: H/O ADHD LD. Seeing outpatient since 2013.  Took Adderall and Vyvanse (dont recall). Took Metadate but caused poor appetite.  Inutuniv and clonidine worked. No h/o inpatient, suicidal attempt, mania, psychosis or self abusive behavior.    Outpatient Encounter Medications as of 08/29/2023  Medication Sig   cloNIDine (CATAPRES) 0.2 MG tablet Take 1 tablet (0.2 mg total) by mouth at bedtime.   lidocaine (XYLOCAINE) 2 % solution Use as directed 15 mLs in the mouth or throat every 3 (three) hours  as needed for mouth pain. Gargle and spit   No facility-administered encounter medications on file as of 08/29/2023.    No results found for this or any previous visit (from the past 2160 hours).   Psychiatric Specialty Exam: Physical Exam  Review of Systems  Weight 126 lb (57.2 kg).There is no height or weight on file to calculate BMI.  General Appearance: Fairly Groomed  Eye Contact:  Fair  Speech:  Normal Rate  Volume:  Normal  Mood:  Euthymic  Affect:  Congruent  Thought Process:  Descriptions of Associations: Intact  Orientation:  Full (Time, Place, and Person)  Thought Content:  WDL  Suicidal Thoughts:  No  Homicidal Thoughts:  No  Memory:  Immediate;   Fair Recent;   Fair Remote;   Fair  Judgement:  Intact  Insight:  Fair  Psychomotor Activity:  Normal  Concentration:  Concentration: Fair and Attention Span: Fair  Recall:  [employer] of Knowledge:  Fair  Language:  Good  Akathisia:  No  Handed:  Right  AIMS (if indicated):     Assets:  Communication Skills Desire for Improvement Housing Social Support Transportation  ADL's:  Intact  Cognition:  WNL  Sleep:  ok     Assessment/Plan: Learning disability - Plan: cloNIDine (CATAPRES) 0.2 MG tablet  ADHD (attention deficit hyperactivity disorder), combined type - Plan: cloNIDine (CATAPRES) 0.2 MG tablet  Patient is stable on low-dose clonidine.  So far no major concern.  His attention, concentration and hyperactivity is stable on the medication.  I emphasized the need to see primary care for physical and blood work.  Continue clonidine 0.2 mg at bedtime.  He is happy as working as a temporary job up to 30 hours a week.  Recommended to call us back if is any question or any concern.  Follow-up in 3 months   Follow Up Instructions:     I discussed the assessment and treatment plan with the patient. The patient was provided an opportunity to ask questions and all were answered. The patient agreed with the plan  and demonstrated an understanding of the instructions.   The patient was advised to call back or seek an in-person evaluation if the symptoms worsen or if the condition fails to improve as anticipated.    Collaboration of Care: Other provider involved in patient's care AEB notes are available in epic to review  Patient/Guardian was advised Release of Information must be obtained prior to any record release in order to collaborate their care with an outside provider. Patient/Guardian was advised if they have not already done so to contact the registration department to sign all necessary forms in order for [place] to release information regarding their care.   Consent: Patient/Guardian gives verbal consent for treatment and assignment of benefits for services provided during this visit. Patient/Guardian expressed understanding and agreed to proceed.     I provided 16 minutes of non face to face time during this encounter.  Note: This document was prepared by [employer] voice dictation technology and any errors that results from this process are unintentional.    Cleotis Nipper, MD 08/29/2023

## 2023-12-23 ENCOUNTER — Telehealth (HOSPITAL_BASED_OUTPATIENT_CLINIC_OR_DEPARTMENT_OTHER): Payer: 59 | Admitting: Psychiatry

## 2023-12-23 ENCOUNTER — Encounter (HOSPITAL_COMMUNITY): Payer: Self-pay | Admitting: Psychiatry

## 2023-12-23 DIAGNOSIS — F819 Developmental disorder of scholastic skills, unspecified: Secondary | ICD-10-CM

## 2023-12-23 DIAGNOSIS — F902 Attention-deficit hyperactivity disorder, combined type: Secondary | ICD-10-CM | POA: Diagnosis not present

## 2023-12-23 MED ORDER — CLONIDINE HCL 0.2 MG PO TABS: 0.2000 mg | ORAL_TABLET | Freq: Every day | ORAL | 3 refills | Status: DC

## 2023-12-23 NOTE — Progress Notes (Signed)
 [facility] MD Virtual Progress Note   Patient Location: In Car Provider Location: Home Office  I connect with patient by video and verified that I am speaking with correct person by using two identifiers. I discussed the limitations of evaluation and management by telemedicine and the availability of in person appointments. I also discussed with the patient that there may be a patient responsible charge related to this service. The patient expressed understanding and agreed to proceed.  Dillon Rodriguez [id number] 23 y.o.  12/23/2023 1:58 PM  History of Present Illness:  Patient is evaluated by video session.  He is taking clonidine 0.2 mg every night which is helping his sleep, hyperactivity, and ADHD symptoms.  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  He works Friday Saturday and Sunday.  He lives with his parents.  Denies any irritability, anger, agitation, mood swings or any suicidal thoughts.  He admitted continued to drink beer when he is not working and usually 1-2 beer maximum a day.  Denies any intoxication, blackouts.  He smokes cigarette but has not smoked marijuana in a while.  Recently he see his primary care Dr. Jaynee Meyer for throat infection and given antibiotic.  He reported his appetite is okay and he lost few pounds because very active.  He do not notice any stress or issue with the work.  Reported is a easy job and he like to continue to do it since it is working very well for him.  His focus attention concentration is good.  He has no tremors or shakes.  He has transportation.  Past Psychiatric History: H/O ADHD and LD. Seeing outpatient since 2013.  Took Adderall and Vyvanse (dont recall). Metadate caused poor appetite.  Inutuniv and clonidine worked. No h/o inpatient, suicidal attempt, mania, psychosis or self abusive behavior.    Outpatient Encounter Medications as of 12/23/2023  Medication Sig   cloNIDine  (CATAPRES) 0.2 MG tablet Take 1 tablet (0.2 mg total) by mouth at bedtime.   lidocaine (XYLOCAINE) 2 % solution Use as directed 15 mLs in the mouth or throat every 3 (three) hours as needed for mouth pain. Gargle and spit   No facility-administered encounter medications on file as of 12/23/2023.    No results found for this or any previous visit (from the past 2160 hours).   Psychiatric Specialty Exam: Physical Exam  Review of Systems  Weight 119 lb (54 kg).There is no height or weight on file to calculate BMI.  General Appearance: Casual  Eye Contact:  Fair  Speech:  Normal Rate  Volume:  Normal  Mood:  Euthymic  Affect:  Congruent  Thought Process:  Goal Directed  Orientation:  Full (Time, Place, and Person)  Thought Content:  Logical  Suicidal Thoughts:  No  Homicidal Thoughts:  No  Memory:  Immediate;   Fair Recent;   Fair Remote;   Fair  Judgement:  Intact  Insight:  Present  Psychomotor Activity:  Normal  Concentration:  Concentration: Fair and Attention Span: Fair  Recall:  Fair  Fund of Knowledge:  Good  Language:  Good  Akathisia:  No  Handed:  Right  AIMS (if indicated):     Assets:  Communication Skills Desire for Improvement Housing Social Support Transportation  ADL's:  Intact  Cognition:  WNL  Sleep:  ok       03/12/2016    2:18 PM  Depression screen PHQ 2/9  Decreased  Interest 0  Down, Depressed, Hopeless 0  PHQ - 2 Score 0    Assessment/Plan: Learning disability - Plan: cloNIDine (CATAPRES) 0.2 MG tablet  ADHD (attention deficit hyperactivity disorder), combined type - Plan: cloNIDine (CATAPRES) 0.2 MG tablet  Patient is stable on low-dose clonidine.  He is pleased that his job is going well and is able to manage without any stress.  Encouraged to see primary care for blood work since has not seen for physical and routine labs in a while.  Patient promised to do it soon.  He does not want to change the medication because it is helping him  keeping him stable.  Continue clonidine 0.2 mg at bedtime.  Recommend to call us  back if is any question or any concern.  Follow-up in 4 months   Follow Up Instructions:     I discussed the assessment and treatment plan with the patient. The patient was provided an opportunity to ask questions and all were answered. The patient agreed with the plan and demonstrated an understanding of the instructions.   The patient was advised to call back or seek an in-person evaluation if the symptoms worsen or if the condition fails to improve as anticipated.    Collaboration of Care: Other provider involved in patient's care AEB notes are available in epic to review  Patient/Guardian was advised Release of Information must be obtained prior to any record release in order to collaborate their care with an outside provider. Patient/Guardian was advised if they have not already done so to contact the registration department to sign all necessary forms in order for us  to release information regarding their care.   Consent: Patient/Guardian gives verbal consent for treatment and assignment of benefits for services provided during this visit. Patient/Guardian expressed understanding and agreed to proceed.     Total encounter time 18 minutes which includes face-to-face time, chart reviewed, care coordination, order entry and documentation during this encounter.   Note: This document was prepared by [employer] voice dictation technology and any errors that results from this process are unintentional.    Arturo Late, MD 12/23/2023

## 2024-01-06 ENCOUNTER — Encounter: Payer: Self-pay | Admitting: Allergy & Immunology

## 2024-01-06 ENCOUNTER — Ambulatory Visit (INDEPENDENT_AMBULATORY_CARE_PROVIDER_SITE_OTHER): Payer: Self-pay | Admitting: Allergy & Immunology

## 2024-01-06 ENCOUNTER — Other Ambulatory Visit: Payer: Self-pay

## 2024-01-06 VITALS — BP 118/70 | HR 71 | Temp 98.2°F | Resp 18 | Ht 65.35 in | Wt 121.4 lb

## 2024-01-06 DIAGNOSIS — J310 Chronic rhinitis: Secondary | ICD-10-CM

## 2024-01-06 DIAGNOSIS — K219 Gastro-esophageal reflux disease without esophagitis: Secondary | ICD-10-CM

## 2024-01-06 MED ORDER — FAMOTIDINE 40 MG PO TABS: 40.0000 mg | ORAL_TABLET | Freq: Every day | ORAL | 3 refills | Status: AC

## 2024-01-06 NOTE — Progress Notes (Signed)
 NEW PATIENT  Date of Service/Encounter:  01/06/24  Consult requested by: Pcp, No   Assessment:   Chronic rhinitis - minimally responsive to antihistamines, therefore I would not be surprised if there is nothing that is positive on testing  Gastroesophageal reflux disease - adding famotidine 40mg  daily EVERY DAY to see if this helps  Current smoker - did not smoke in 2024 without resolution of symptoms, but now smoking again without any worsening of symptoms  Plan/Recommendations:   1. Chronic rhinitis - Because of insurance stipulations, we cannot do skin testing on the same day as your first visit. - We are all working to fight this, but for now we need to do two separate visits.  - We will know more after we do testing at the next visit.  - The skin testing visit can be squeezed in at your convenience.  - Then we can make a more full plan to address all of your symptoms. - Be sure to stop your antihistamines for 3 days before this appointment.  - This will help us  to figure which might be causing symptoms.    2. Gastroesophageal reflux disease - Start Pepcid 40mg  daily to see if that helps. - Take this every day to see if this helps.  - Spicy foods can make GERD worse.  3. Return in about 1 week (around 01/13/2024) for SKIN TESTING (1-55). You can have the follow up appointment with Dr. Idolina Maker or a Nurse Practicioner (our Nurse Practitioners are excellent and always have Physician oversight!).   This note in its entirety was forwarded to the Provider who requested this consultation.  Subjective:   Dillon Rodriguez is a 24 y.o. male presenting today for evaluation of  Chief Complaint  Patient presents with   Allergic Rhinitis     Post nasal  drip, sneezing, mucus clear - tried an otc zyrtec  allergy med and flonase. Finishe 4 day antibiotic las week      Dillon Rodriguez has a history of the following: Patient Active Problem List   Diagnosis Date Noted   Elevated  blood pressure reading 10/17/2021   Wellness examination 10/17/2021   Prediabetes 10/11/2021   Migraine without aura and without status migrainosus, not intractable 04/02/2017   Tension headache 04/02/2017   ADHD (attention deficit hyperactivity disorder), combined type 01/24/2012    History obtained from: chart review and patient.  Discussed the use of AI scribe software for clinical note transcription with the patient and/or guardian, who gave verbal consent to proceed.  Dillon Rodriguez was referred by Pcp, No.     Dillon Rodriguez is a 23 y.o. male presenting for an evaluation of environmental allergies .  Allergic Rhinitis Symptom History: He has experienced environmental allergies for the past two to three years, which began after moving to a new area in 2019. Symptoms include excessive mucus production, particularly in the mornings, and sneezing. He notes improvement in symptoms when staying at friends' houses, suggesting a possible environmental trigger at home. He lives with his mother, who smokes and uses candles and incense, and suspects that environmental factors in his home, such as smoke and potential mold, may contribute to his symptoms. He attempted smoking cessation in 2024, which did not result in symptom improvement. He also tried antihistamines last year without relief.  GERD Symptom History: He has been informed by his dentist about signs of reflux, although he denies heartburn. He has tried Pepcid in the past but does not take it regularly. His dentist has  noted potential reflux-related dental erosion over the past year. He consumes spicy foods regularly, which he acknowledges could exacerbate reflux symptoms.  He has a history of ADHD and currently takes clonidine  for sleep. He has previously tried multiple ADHD medications, including Adderall and Ritalin , but discontinued them due to side effects. No skin problems like eczema and no family history of autoimmune diseases. A cousin has  allergies and nasal polyps.   He was working on a degree in Chief of Staff.  Otherwise, there is no history of other atopic diseases, including drug allergies, stinging insect allergies, or contact dermatitis. There is no significant infectious history. Vaccinations are up to date.    Past Medical History: Patient Active Problem List   Diagnosis Date Noted   Elevated blood pressure reading 10/17/2021   Wellness examination 10/17/2021   Prediabetes 10/11/2021   Migraine without aura and without status migrainosus, not intractable 04/02/2017   Tension headache 04/02/2017   ADHD (attention deficit hyperactivity disorder), combined type 01/24/2012    Medication List:  Allergies as of 01/06/2024   No Known Allergies      Medication List        Accurate as of January 06, 2024 12:28 PM. If you have any questions, ask your nurse or doctor.          azithromycin 250 MG tablet Commonly known as: ZITHROMAX Take 250 mg by mouth as directed.   cloNIDine  0.2 MG tablet Commonly known as: CATAPRES  Take 1 tablet (0.2 mg total) by mouth at bedtime.   famotidine 40 MG tablet Commonly known as: PEPCID Take 1 tablet (40 mg total) by mouth daily. Started by: Rochester Chuck   lidocaine  2 % solution Commonly known as: XYLOCAINE  Use as directed 15 mLs in the mouth or throat every 3 (three) hours as needed for mouth pain. Gargle and spit        Birth History: non-contributory  Developmental History: non-contributory  Past Surgical History: History reviewed. No pertinent surgical history.   Family History: Family History  Problem Relation Age of Onset   Migraines Mother    Eczema Sister    Stroke Maternal Grandmother    Stroke Maternal Grandfather      Social History: Sho lives at home with his family. They live in a condo that is [address] years old. There is carepting throughout the home. There are no animals inside or outside of the home. He currently works  for [employer] in [employer], materials. He has been there for 8 months. There is no fume, chemical or dust exposure in the home. There is no HEPA filter in the home.    Review of systems otherwise negative other than that mentioned in the HPI.    Objective:   Blood pressure 118/70, pulse 71, temperature 98.2 F (36.8 C), temperature source Temporal, resp. rate 18, height 5' 5.35" (1.66 m), weight 121 lb 6.4 oz (55.1 kg), SpO2 98%. Body mass index is 19.98 kg/m.     Physical Exam Constitutional:      Appearance: He is well-developed.  HENT:     Head: Normocephalic and atraumatic.     Right Ear: Tympanic membrane, ear canal and external ear normal. No drainage, swelling or tenderness. Tympanic membrane is not injected, scarred, erythematous, retracted or bulging.     Left Ear: Tympanic membrane, ear canal and external ear normal. No drainage, swelling or tenderness. Tympanic membrane is not injected, scarred, erythematous, retracted or bulging.     Nose: Rhinorrhea present. No  nasal deformity, septal deviation, nasal tenderness, mucosal edema or congestion.     Right Turbinates: Enlarged and swollen.     Left Turbinates: Enlarged and swollen.     Right Sinus: No maxillary sinus tenderness or frontal sinus tenderness.     Left Sinus: No maxillary sinus tenderness or frontal sinus tenderness.     Mouth/Throat:     Lips: Pink.     Mouth: Mucous membranes are moist. Mucous membranes are not pale and not dry.     Pharynx: Uvula midline.     Comments: Mild cobblestoning present.  Eyes:     General:        Right eye: No discharge.        Left eye: No discharge.     Conjunctiva/sclera: Conjunctivae normal.     Right eye: Right conjunctiva is not injected. No chemosis.    Left eye: Left conjunctiva is not injected. No chemosis.    Pupils: Pupils are equal, round, and reactive to light.  Cardiovascular:     Rate and Rhythm: Normal rate and regular rhythm.     Heart sounds: Normal  heart sounds.  Pulmonary:     Effort: Pulmonary effort is normal. No tachypnea, accessory muscle usage or respiratory distress.     Breath sounds: Normal breath sounds. No wheezing, rhonchi or rales.  Chest:     Chest wall: No tenderness.  Abdominal:     Tenderness: There is no abdominal tenderness. There is no guarding or rebound.  Lymphadenopathy:     Head:     Right side of head: No submandibular, tonsillar or occipital adenopathy.     Left side of head: No submandibular, tonsillar or occipital adenopathy.     Cervical: No cervical adenopathy.  Skin:    Coloration: Skin is not pale.     Findings: No abrasion, erythema, petechiae or rash. Rash is not papular, urticarial or vesicular.  Neurological:     Mental Status: He is alert.      Diagnostic studies: deferred due to insurance stipulations that require a separate visit for testing         Drexel Gentles, MD [facility] 

## 2024-01-06 NOTE — Patient Instructions (Addendum)
 1. Chronic rhinitis - Because of insurance stipulations, we cannot do skin testing on the same day as your first visit. - We are all working to fight this, but for now we need to do two separate visits.  - We will know more after we do testing at the next visit.  - The skin testing visit can be squeezed in at your convenience.  - Then we can make a more full plan to address all of your symptoms. - Be sure to stop your antihistamines for 3 days before this appointment.  - This will help us  to figure which might be causing symptoms.    2. Gastroesophageal reflux disease - Start Pepcid 40mg  daily to see if that helps. - Take this every day to see if this helps.  - Spicy foods can make GERD worse.  3. Return in about 1 week (around 01/13/2024) for SKIN TESTING (1-55). You can have the follow up appointment with Dr. Idolina Maker or a Nurse Practicioner (our Nurse Practitioners are excellent and always have Physician oversight!).    Please inform us  of any Emergency Department visits, hospitalizations, or changes in symptoms. Call us  before going to the ED for breathing or allergy symptoms since we might be able to fit you in for a sick visit. Feel free to contact us  anytime with any questions, problems, or concerns.  It was a pleasure to meet you today!  Websites that have reliable patient information: 1. American Academy of Asthma, Allergy, and Immunology: www.aaaai.org 2. Food Allergy Research and Education (FARE): foodallergy.org 3. Mothers of Asthmatics: http://www.asthmacommunitynetwork.org 4. American College of Allergy, Asthma, and Immunology: www.acaai.org      "Like" us  on Facebook and Instagram for our latest updates!      A healthy democracy works best when [employer] participate! Make sure you are registered to vote! If you have moved or changed any of your contact information, you will need to get this updated before voting! Scan the QR codes below to learn more!

## 2024-01-16 ENCOUNTER — Ambulatory Visit (INDEPENDENT_AMBULATORY_CARE_PROVIDER_SITE_OTHER): Payer: Self-pay | Admitting: Allergy & Immunology

## 2024-01-16 ENCOUNTER — Encounter: Payer: Self-pay | Admitting: Allergy & Immunology

## 2024-01-16 DIAGNOSIS — K219 Gastro-esophageal reflux disease without esophagitis: Secondary | ICD-10-CM

## 2024-01-16 DIAGNOSIS — J310 Chronic rhinitis: Secondary | ICD-10-CM

## 2024-01-16 NOTE — Patient Instructions (Addendum)
 1. Chronic rhinitis - Your histamine was NEGATIVE today, therefore we could not do testing since it probably all would have shown up as NEGATIVE (and would have been a waste). - This is likely because of the NyQuil you take every night or so (this contains diphenhydramine, which is Benadryl).  - We are going to get blood work instead to look for environmental allergens. - This is not as sensitive as the back testing on the skin, but it might be able to show something. - We might need to follow-up with skin testing. - This will show if you are sensitized or allergic to molds (which would mean that you have been exposed to them at some point in your life) - We we will call you in 1 to 2 weeks for the results of the testing.  2. Gastroesophageal reflux disease - Continue Pepcid  40mg  daily to see if that helps. - Spicy foods can make GERD worse.  3. Return in about 3 months (around 04/17/2024). You can have the follow up appointment with Dr. Idolina Maker or a Nurse Practicioner (our Nurse Practitioners are excellent and always have Physician oversight!).    Please inform us  of any Emergency Department visits, hospitalizations, or changes in symptoms. Call us  before going to the ED for breathing or allergy symptoms since we might be able to fit you in for a sick visit. Feel free to contact us  anytime with any questions, problems, or concerns.  It was a pleasure to see you again today!  Websites that have reliable patient information: 1. American Academy of Asthma, Allergy, and Immunology: www.aaaai.org 2. Food Allergy Research and Education (FARE): foodallergy.org 3. Mothers of Asthmatics: http://www.asthmacommunitynetwork.org 4. American College of Allergy, Asthma, and Immunology: www.acaai.org      "Like" us  on Facebook and Instagram for our latest updates!      A healthy democracy works best when [employer] participate! Make sure you are registered to vote! If you have moved or changed any  of your contact information, you will need to get this updated before voting! Scan the QR codes below to learn more!

## 2024-01-16 NOTE — Progress Notes (Signed)
 FOLLOW UP  Date of Service/Encounter:  01/16/24   Assessment:   Chronic rhinitis - minimally responsive to antihistamines, therefore I would not be surprised if there is nothing that is positive on testing   Gastroesophageal reflux disease - continuing on famotidine  40mg  daily EVERY DAY to see if this helps   Current smoker - did not smoke in 2024 without resolution of symptoms, but now smoking again without any worsening of symptoms     Plan/Recommendations:   1. Chronic rhinitis - with non reactive histamine today  - Your histamine was NEGATIVE today, therefore we could not do testing since it probably all would have shown up as NEGATIVE (and would have been a waste). - This is likely because of the NyQuil you take every night or so (this contains diphenhydramine, which is Benadryl).  - We are going to get blood work instead to look for environmental allergens. - This is not as sensitive as the back testing on the skin, but it might be able to show something. - We might need to follow-up with skin testing. - This will show if you are sensitized or allergic to molds (which would mean that you have been exposed to them at some point in your life) - We we will call you in 1 to 2 weeks for the results of the testing.  2. Gastroesophageal reflux disease - Continue Pepcid  40mg  daily to see if that helps. - Spicy foods can make GERD worse.  3. Return in about 3 months (around 04/17/2024). You can have the follow up appointment with Dr. Idolina Maker or a Nurse Practicioner (our Nurse Practitioners are excellent and always have Physician oversight!).   Subjective:   Dillon Rodriguez is a 23 y.o. male presenting today for follow up of  Chief Complaint  Patient presents with   Allergy Testing    1-55 Took antihistamines.    Dillon Rodriguez has a history of the following: Patient Active Problem List   Diagnosis Date Noted   Elevated blood pressure reading 10/17/2021   Wellness  examination 10/17/2021   Prediabetes 10/11/2021   Migraine without aura and without status migrainosus, not intractable 04/02/2017   Tension headache 04/02/2017   ADHD (attention deficit hyperactivity disorder), combined type 01/24/2012    History obtained from: chart review and patient.  Discussed the use of AI scribe software for clinical note transcription with the patient and/or guardian, who gave verbal consent to proceed.  Dillon Rodriguez is a 23 y.o. male presenting for skin testing.  Dillon Rodriguez was last seen in April 2025 for evaluation of chronic rhinitis and recurrent sinusitis.  We did start him on Pepcid  40 mg daily to see if that helped at all with his sensation of dysphagia.  Dillon Rodriguez was feeling like there was a lot of mucus going down his throat, so Dillon Rodriguez thought that might help.  We did a histamine when Dillon Rodriguez came in and this was actually negative.  We reviewed medications and Dillon Rodriguez apparently takes NyQuil every night or so to help him sleep and help with the postnasal drip.  Therefore, that is probably why the positive control was actually negative.  From a rhinitis perspective, Dillon Rodriguez has been on all of the antihistamines without improvement.  Dillon Rodriguez has tried some nose sprays, although not routinely.  Dillon Rodriguez is okay with getting some blood work done today with the possibility of follow-up with skin testing if needed.  Dillon Rodriguez is using his reflux medication.  Dillon Rodriguez is not sure if it is really  helped much at all.  Otherwise, there have been no changes to his past medical history, surgical history, family history, or social history.    Review of systems otherwise negative other than that mentioned in the HPI.    Objective:   There were no vitals taken for this visit. There is no height or weight on file to calculate BMI.    Physical Exam Vitals reviewed.  Constitutional:      Appearance: Dillon Rodriguez is well-developed.  HENT:     Head: Normocephalic and atraumatic.     Right Ear: Tympanic membrane, ear canal and  external ear normal. No drainage, swelling or tenderness. Tympanic membrane is not injected, scarred, erythematous, retracted or bulging.     Left Ear: Tympanic membrane, ear canal and external ear normal. No drainage, swelling or tenderness. Tympanic membrane is not injected, scarred, erythematous, retracted or bulging.     Nose: Rhinorrhea present. No nasal deformity, septal deviation, nasal tenderness, mucosal edema or congestion.     Right Turbinates: Enlarged, swollen and pale.     Left Turbinates: Enlarged, swollen and pale.     Right Sinus: No maxillary sinus tenderness or frontal sinus tenderness.     Left Sinus: No maxillary sinus tenderness or frontal sinus tenderness.     Mouth/Throat:     Lips: Pink.     Mouth: Mucous membranes are moist. Mucous membranes are not pale and not dry.     Pharynx: Uvula midline.     Comments: Mild cobblestoning present.  Eyes:     General:        Right eye: No discharge.        Left eye: No discharge.     Conjunctiva/sclera: Conjunctivae normal.     Right eye: Right conjunctiva is not injected. No chemosis.    Left eye: Left conjunctiva is not injected. No chemosis.    Pupils: Pupils are equal, round, and reactive to light.  Cardiovascular:     Rate and Rhythm: Normal rate and regular rhythm.     Heart sounds: Normal heart sounds.  Pulmonary:     Effort: Pulmonary effort is normal. No tachypnea, accessory muscle usage or respiratory distress.     Breath sounds: Normal breath sounds. No wheezing, rhonchi or rales.  Chest:     Chest wall: No tenderness.  Lymphadenopathy:     Head:     Right side of head: No submandibular, tonsillar or occipital adenopathy.     Left side of head: No submandibular, tonsillar or occipital adenopathy.     Cervical: No cervical adenopathy.  Skin:    Coloration: Skin is not pale.     Findings: No abrasion, erythema, petechiae or rash. Rash is not papular, urticarial or vesicular.  Neurological:     Mental Status:  Dillon Rodriguez is alert.  Psychiatric:        Behavior: Behavior is cooperative.      Diagnostic studies:   Allergy Studies:     Airborne Adult Perc - 01/16/24 1059     Time Antigen Placed 1059    Allergen Manufacturer Floyd Hutchinson    Location Arm    Number of Test 1    2. Control-Histamine Negative             Allergy testing results were read and interpreted by myself, documented by clinical staff.      Dillon Gentles, MD  [facility] 

## 2024-01-18 LAB — ALLERGENS W/COMP RFLX AREA 2
Alternaria Alternata IgE: <0.1 kU/L
Aspergillus Fumigatus IgE: <0.1 kU/L
Bermuda Grass IgE: <0.1 kU/L
Cedar, Mountain IgE: <0.1 kU/L
Cladosporium Herbarum IgE: <0.1 kU/L
Cockroach, German IgE: <0.1 kU/L
Common Silver Birch IgE: <0.1 kU/L
Cottonwood IgE: <0.1 kU/L
D Farinae IgE: <0.1 kU/L
D Pteronyssinus IgE: <0.1 kU/L
E001-IgE Cat Dander: <0.1 kU/L
E005-IgE Dog Dander: <0.1 kU/L
Elm, American IgE: <0.1 kU/L
IgE (Immunoglobulin E), Serum: 20 [IU]/mL (ref 6–495)
Johnson Grass IgE: <0.1 kU/L
Maple/Box Elder IgE: <0.1 kU/L
Mouse Urine IgE: <0.1 kU/L
Oak, White IgE: <0.1 kU/L
Pecan, Hickory IgE: <0.1 kU/L
Penicillium Chrysogen IgE: <0.1 kU/L
Pigweed, Rough IgE: <0.1 kU/L
Ragweed, Short IgE: <0.1 kU/L
Sheep Sorrel IgE Qn: <0.1 kU/L
Timothy Grass IgE: <0.1 kU/L
White Mulberry IgE: <0.1 kU/L

## 2024-01-18 LAB — ALLERGEN PROFILE, MOLD
Aureobasidi Pullulans IgE: <0.1 kU/L
Candida Albicans IgE: <0.1 kU/L
M009-IgE Fusarium proliferatum: <0.1 kU/L
M014-IgE Epicoccum purpur: <0.1 kU/L
Mucor Racemosus IgE: <0.1 kU/L
Phoma Betae IgE: <0.1 kU/L
Setomelanomma Rostrat: <0.1 kU/L
Stemphylium Herbarum IgE: <0.1 kU/L

## 2024-01-18 LAB — TRYPTASE: Tryptase: 3.6 ug/L (ref 2.2–13.2)

## 2024-01-29 ENCOUNTER — Ambulatory Visit: Payer: Self-pay | Admitting: Allergy & Immunology

## 2024-04-23 ENCOUNTER — Telehealth (HOSPITAL_BASED_OUTPATIENT_CLINIC_OR_DEPARTMENT_OTHER): Payer: Self-pay | Admitting: Psychiatry

## 2024-04-23 ENCOUNTER — Ambulatory Visit: Admitting: Allergy & Immunology

## 2024-04-23 ENCOUNTER — Encounter (HOSPITAL_COMMUNITY): Payer: Self-pay | Admitting: Psychiatry

## 2024-04-23 ENCOUNTER — Telehealth (HOSPITAL_COMMUNITY): Admitting: Psychiatry

## 2024-04-23 DIAGNOSIS — F819 Developmental disorder of scholastic skills, unspecified: Secondary | ICD-10-CM | POA: Diagnosis not present

## 2024-04-23 DIAGNOSIS — F902 Attention-deficit hyperactivity disorder, combined type: Secondary | ICD-10-CM

## 2024-04-23 MED ORDER — CLONIDINE HCL 0.2 MG PO TABS: 0.2000 mg | ORAL_TABLET | Freq: Every day | ORAL | 3 refills | Status: DC

## 2024-04-23 NOTE — Progress Notes (Signed)
 [facility] MD Virtual Progress Note   Patient Location: In Car Provider Location: Office  I connect with patient by video and verified that I am speaking with correct person by using two identifiers. I discussed the limitations of evaluation and management by telemedicine and the availability of in person appointments. I also discussed with the patient that there may be a patient responsible charge related to this service. The patient expressed understanding and agreed to proceed.  Dillon Rodriguez [id number] 23 y.o.  04/23/2024 1:05 PM  History of Present Illness:  Patient is evaluated by video session.  He is doing better on clonidine 0.2 mg at bedtime.  Which is helping his hyperactivity, sleep and anxiety.  Today is his first day at work at [employer].  He is excited about his job.  Patient finished orientation but also get training at work.  He is living with his parents.  He denies any new relationship.  He described his mood stable and much better.  He denies any anger, mood swings, irritability, suicidal thoughts or homicidal thoughts.  His appetite is okay.  His weight is stable.  He has no tremors, shakes or any EPS.  His attention concentration is good and he is hoping that he can do very well at his new job.  He feel he is able to do multitasking.  His sleep is good.  Past Psychiatric History: H/O ADHD and LD. Seeing outpatient since 2013.  Took Adderall and Vyvanse (dont recall). Metadate caused poor appetite.  Inutuniv and clonidine worked. No h/o inpatient, suicidal attempt, mania, psychosis or self abusive behavior.   Past Medical History:  Diagnosis Date   ADHD (attention deficit hyperactivity disorder)    Learning disability 02/02/2015   ODD (oppositional defiant disorder) 02/02/2015   Oppositional defiant disorder    Recurrent upper respiratory infection (URI)     Outpatient Encounter Medications as of 04/23/2024  Medication Sig   cloNIDine  (CATAPRES) 0.2 MG tablet Take 1 tablet (0.2 mg total) by mouth at bedtime.   famotidine (PEPCID) 40 MG tablet Take 1 tablet (40 mg total) by mouth daily.   No facility-administered encounter medications on file as of 04/23/2024.    No results found for this or any previous visit (from the past 2160 hours).   Psychiatric Specialty Exam: Physical Exam  Review of Systems  Weight 120 lb (54.4 kg).There is no height or weight on file to calculate BMI.  General Appearance: Casual  Eye Contact:  Fair  Speech:  Slow  Volume:  Normal  Mood:  Euthymic  Affect:  Appropriate  Thought Process:  Descriptions of Associations: Intact  Orientation:  Full (Time, Place, and Person)  Thought Content:  WDL  Suicidal Thoughts:  No  Homicidal Thoughts:  No  Memory:  Immediate;   Fair Recent;   Fair Remote;   Fair  Judgement:  Intact  Insight:  Present  Psychomotor Activity:  Normal  Concentration:  Concentration: Fair and Attention Span: Good  Recall:  Good  Fund of Knowledge:  Good  Language:  Good  Akathisia:  No  Handed:  Right  AIMS (if indicated):     Assets:  Communication Skills Desire for Improvement Housing Resilience Social Support Transportation  ADL's:  Intact  Cognition:  WNL  Sleep:  ok       03/12/2016    2:18 PM  Depression screen PHQ 2/9  Decreased Interest 0  Down, Depressed, Hopeless 0  PHQ - 2 Score 0  Assessment/Plan: Learning disability - Plan: cloNIDine (CATAPRES) 0.2 MG tablet  ADHD (attention deficit hyperactivity disorder), combined type - Plan: cloNIDine (CATAPRES) 0.2 MG tablet  Patient is stable on low-dose clonidine.  He started his first day at work at [employer].  Patient excited about his job.  Will continue clonidine 0.2 mg at bedtime.  Recommend to call back if any question or any concern.  Follow-up in 4 months.      Follow Up Instructions:     I discussed the assessment and treatment plan with the patient. The patient was  provided an opportunity to ask questions and all were answered. The patient agreed with the plan and demonstrated an understanding of the instructions.   The patient was advised to call back or seek an in-person evaluation if the symptoms worsen or if the condition fails to improve as anticipated.    Collaboration of Care: Other provider involved in patient's care AEB notes are available in epic to review  Patient/Guardian was advised Release of Information must be obtained prior to any record release in order to collaborate their care with an outside provider. Patient/Guardian was advised if they have not already done so to contact the registration department to sign all necessary forms in order for us  to release information regarding their care.   Consent: Patient/Guardian gives verbal consent for treatment and assignment of benefits for services provided during this visit. Patient/Guardian expressed understanding and agreed to proceed.     Total encounter time 16 minutes which includes face-to-face time, chart reviewed, care coordination, order entry and documentation during this encounter.   Note: This document was prepared by [employer] voice dictation technology and any errors that results from this process are unintentional.    Leni ONEIDA Client, MD 04/23/2024

## 2024-08-24 ENCOUNTER — Telehealth (HOSPITAL_COMMUNITY): Admitting: Psychiatry

## 2024-08-24 ENCOUNTER — Encounter (HOSPITAL_COMMUNITY): Payer: Self-pay

## 2024-08-24 DIAGNOSIS — Z91199 Patient's noncompliance with other medical treatment and regimen due to unspecified reason: Secondary | ICD-10-CM

## 2024-08-24 NOTE — Progress Notes (Signed)
 Patient is no-show on video platform

## 2024-09-14 ENCOUNTER — Other Ambulatory Visit (HOSPITAL_COMMUNITY): Payer: Self-pay | Admitting: *Deleted

## 2024-09-14 DIAGNOSIS — F819 Developmental disorder of scholastic skills, unspecified: Secondary | ICD-10-CM

## 2024-09-14 DIAGNOSIS — F902 Attention-deficit hyperactivity disorder, combined type: Secondary | ICD-10-CM

## 2024-09-14 MED ORDER — CLONIDINE HCL 0.2 MG PO TABS: 0.2000 mg | ORAL_TABLET | Freq: Every day | ORAL | 0 refills | Status: DC

## 2024-09-23 ENCOUNTER — Encounter (HOSPITAL_COMMUNITY): Payer: Self-pay | Admitting: Psychiatry

## 2024-09-23 ENCOUNTER — Telehealth (HOSPITAL_COMMUNITY): Admitting: Psychiatry

## 2024-09-23 VITALS — Wt 118.0 lb

## 2024-09-23 DIAGNOSIS — F902 Attention-deficit hyperactivity disorder, combined type: Secondary | ICD-10-CM | POA: Diagnosis not present

## 2024-09-23 DIAGNOSIS — F1011 Alcohol abuse, in remission: Secondary | ICD-10-CM

## 2024-09-23 DIAGNOSIS — F819 Developmental disorder of scholastic skills, unspecified: Secondary | ICD-10-CM | POA: Diagnosis not present

## 2024-09-23 MED ORDER — CLONIDINE HCL 0.2 MG PO TABS: 0.2000 mg | ORAL_TABLET | Freq: Every day | ORAL | 2 refills | Status: AC

## 2024-09-23 NOTE — Progress Notes (Signed)
 " [facility] MD Virtual Progress Note   Patient Location: Home Provider Location: Home Office  I connect with patient by video and verified that I am speaking with correct person by using two identifiers. I discussed the limitations of evaluation and management by telemedicine and the availability of in person appointments. I also discussed with the patient that there may be a patient responsible charge related to this service. The patient expressed understanding and agreed to proceed.  Dillon Rodriguez [id number] 23 y.o.  09/23/2024 11:27 AM  History of Present Illness:  Patient is evaluated by video session.  He apologized missing his last appointment.  Patient told he is looking for a job since previous job did not work out for him.  He work at [employer] and then he work at [employer] but due to our staff could not continue his job.  He is living with his parents.  Recently he visited unemployment agency to get resources so he can find the job.  Patient admitted last year was difficult because he tried few jobs that did not work and he was drinking alcohol and smoking pot.  Now he has make a new ear commitment that he will not drink or smoke and try to focus on his general health.  He also had a plan to move out from his parents house.  He is going to be 26 year old this year.  He feels the medicine working.  There are days when he did not took the medicine as he ran out he struggle with sleep and his symptoms come back.  He is sleeping better with clonidine  and he denies any mood swing, anger, irritability, mania, psychosis.  Despite good appetite his weight stable.  He reported his attention concentration is okay.  He is not agitated angry or manic.  He has no tremors or shakes.  Past Psychiatric History: H/O ADHD and LD. Seeing outpatient since 2013.  Took Adderall and Vyvanse (dont recall). Metadate  caused poor appetite.  Inutuniv and clonidine  worked. No h/o inpatient,  suicidal attempt, mania, psychosis or self abusive behavior.   Past Medical History:  Diagnosis Date   ADHD (attention deficit hyperactivity disorder)    Learning disability 02/02/2015   ODD (oppositional defiant disorder) 02/02/2015   Oppositional defiant disorder    Recurrent upper respiratory infection (URI)     Outpatient Encounter Medications as of 09/23/2024  Medication Sig   cloNIDine  (CATAPRES ) 0.2 MG tablet Take 1 tablet (0.2 mg total) by mouth at bedtime.   famotidine  (PEPCID ) 40 MG tablet Take 1 tablet (40 mg total) by mouth daily.   No facility-administered encounter medications on file as of 09/23/2024.    No results found for this or any previous visit (from the past 2160 hours).   Psychiatric Specialty Exam: Physical Exam  Review of Systems  There were no vitals taken for this visit.There is no height or weight on file to calculate BMI.  General Appearance: Casual  Eye Contact:  Fair  Speech:  Slow  Volume:  Normal  Mood:  Euthymic  Affect:  Appropriate  Thought Process:  Descriptions of Associations: Intact  Orientation:  Full (Time, Place, and Person)  Thought Content:  WDL  Suicidal Thoughts:  No  Homicidal Thoughts:  No  Memory:  Immediate;   Fair Recent;   Fair Remote;   Fair  Judgement:  Intact  Insight:  Present  Psychomotor Activity:  Normal  Concentration:  Concentration: Fair and Attention Span: Good  Recall:  Good  Fund of Knowledge:  Good  Language:  Good  Akathisia:  No  Handed:  Right  AIMS (if indicated):     Assets:  Communication Skills Desire for Improvement Housing Resilience Social Support Transportation  ADL's:  Intact  Cognition:  WNL  Sleep:  ok       03/12/2016    2:18 PM  Depression screen PHQ 2/9  Decreased Interest 0  Down, Depressed, Hopeless 0  PHQ - 2 Score 0    Assessment/Plan: ADHD (attention deficit hyperactivity disorder), combined type - Plan: cloNIDine  (CATAPRES ) 0.2 MG tablet  Learning disability -  Plan: cloNIDine  (CATAPRES ) 0.2 MG tablet  H/O ETOH abuse - Plan: cloNIDine  (CATAPRES ) 0.2 MG tablet  Patient is 24 year old African-American unemployed man with history of learning disability, ADHD, combined type and history of alcohol use but currently reported sober since new year.  He apologized missing his last appointment.  His plan is to focus on his health, get a job, remain sober from drinking and cannabis use and plan to move out from his parents house.  He feels the current medicine is working and his symptoms are stable.  He had a visit with his primary care at [facility] physician at [facility] last May.  Discussed medication side effects and benefits.  Continue clonidine  0.2 mg at bedtime.  Recommend to call back if is any question or any concern.  Follow-up in 3 months.    Follow Up Instructions:     I discussed the assessment and treatment plan with the patient. The patient was provided an opportunity to ask questions and all were answered. The patient agreed with the plan and demonstrated an understanding of the instructions.   The patient was advised to call back or seek an in-person evaluation if the symptoms worsen or if the condition fails to improve as anticipated.    Collaboration of Care: Other provider involved in patient's care AEB notes are available in epic to review  Patient/Guardian was advised Release of Information must be obtained prior to any record release in order to collaborate their care with an outside provider. Patient/Guardian was advised if they have not already done so to contact the registration department to sign all necessary forms in order for us  to release information regarding their care.   Consent: Patient/Guardian gives verbal consent for treatment and assignment of benefits for services provided during this visit. Patient/Guardian expressed understanding and agreed to proceed.     Total encounter time 19 minutes which includes face-to-face time,  chart reviewed, care coordination, order entry and documentation during this encounter.   Note: This document was prepared by [employer] voice dictation technology and any errors that results from this process are unintentional.    Leni ONEIDA Client, MD 09/23/2024   "

## 2024-12-23 ENCOUNTER — Telehealth (HOSPITAL_COMMUNITY): Admitting: Psychiatry
